# Patient Record
Sex: Male | Born: 1975 | Race: White | Hispanic: No | Marital: Single | Smoking: Current every day smoker
Health system: Southern US, Community
[De-identification: ages and names within clinical notes are randomized; demographics above are authoritative.]

## PROBLEM LIST (undated history)

## (undated) DIAGNOSIS — M2550 Pain in unspecified joint: Secondary | ICD-10-CM

## (undated) DIAGNOSIS — F17200 Nicotine dependence, unspecified, uncomplicated: Secondary | ICD-10-CM

## (undated) HISTORY — PX: MOUTH SURGERY: SHX715

---

## 2013-03-24 ENCOUNTER — Emergency Department (INDEPENDENT_AMBULATORY_CARE_PROVIDER_SITE_OTHER)
Admission: EM | Admit: 2013-03-24 | Discharge: 2013-03-24 | Disposition: A | Discharge status: Hospice | Payer: Self-pay | Source: Home / Self Care | Attending: Emergency Medicine | Admitting: Emergency Medicine

## 2013-03-24 ENCOUNTER — Emergency Department (HOSPITAL_COMMUNITY): Payer: Self-pay

## 2013-03-24 ENCOUNTER — Emergency Department (HOSPITAL_COMMUNITY)
Admission: EM | Admit: 2013-03-24 | Discharge: 2013-03-25 | Disposition: A | Discharge status: Home / Self Care | Payer: Self-pay | Attending: Emergency Medicine | Admitting: Emergency Medicine

## 2013-03-24 ENCOUNTER — Encounter (HOSPITAL_COMMUNITY): Payer: Self-pay | Admitting: Adult Health

## 2013-03-24 ENCOUNTER — Ambulatory Visit (HOSPITAL_COMMUNITY): Payer: Self-pay | Attending: Emergency Medicine

## 2013-03-24 ENCOUNTER — Encounter (HOSPITAL_COMMUNITY): Payer: Self-pay | Admitting: Emergency Medicine

## 2013-03-24 DIAGNOSIS — R109 Unspecified abdominal pain: Secondary | ICD-10-CM | POA: Insufficient documentation

## 2013-03-24 DIAGNOSIS — D45 Polycythemia vera: Secondary | ICD-10-CM

## 2013-03-24 DIAGNOSIS — A048 Other specified bacterial intestinal infections: Secondary | ICD-10-CM

## 2013-03-24 DIAGNOSIS — D751 Secondary polycythemia: Secondary | ICD-10-CM

## 2013-03-24 DIAGNOSIS — K5289 Other specified noninfective gastroenteritis and colitis: Secondary | ICD-10-CM

## 2013-03-24 DIAGNOSIS — F1310 Sedative, hypnotic or anxiolytic abuse, uncomplicated: Secondary | ICD-10-CM | POA: Insufficient documentation

## 2013-03-24 DIAGNOSIS — F17200 Nicotine dependence, unspecified, uncomplicated: Secondary | ICD-10-CM | POA: Insufficient documentation

## 2013-03-24 DIAGNOSIS — K529 Noninfective gastroenteritis and colitis, unspecified: Secondary | ICD-10-CM

## 2013-03-24 DIAGNOSIS — K760 Fatty (change of) liver, not elsewhere classified: Secondary | ICD-10-CM

## 2013-03-24 DIAGNOSIS — K700 Alcoholic fatty liver: Secondary | ICD-10-CM | POA: Insufficient documentation

## 2013-03-24 DIAGNOSIS — R197 Diarrhea, unspecified: Secondary | ICD-10-CM | POA: Insufficient documentation

## 2013-03-24 DIAGNOSIS — R112 Nausea with vomiting, unspecified: Secondary | ICD-10-CM | POA: Insufficient documentation

## 2013-03-24 DIAGNOSIS — R61 Generalized hyperhidrosis: Secondary | ICD-10-CM | POA: Insufficient documentation

## 2013-03-24 DIAGNOSIS — F411 Generalized anxiety disorder: Secondary | ICD-10-CM | POA: Insufficient documentation

## 2013-03-24 LAB — POCT URINALYSIS DIP (DEVICE)
Hgb urine dipstick: NEGATIVE
Nitrite: NEGATIVE
Protein, ur: NEGATIVE mg/dL
Urobilinogen, UA: 0.2 mg/dL (ref 0.0–1.0)
pH: 7 (ref 5.0–8.0)

## 2013-03-24 LAB — CBC WITH DIFFERENTIAL/PLATELET
Basophils Relative: 0 % (ref 0–1)
Basophils Relative: 0 % (ref 0–1)
Eosinophils Absolute: 0 10*3/uL (ref 0.0–0.7)
Eosinophils Absolute: 0 10*3/uL (ref 0.0–0.7)
Eosinophils Relative: 1 % (ref 0–5)
Eosinophils Relative: 0 % (ref 0–5)
Hemoglobin: 22.5 g/dL (ref 13.0–17.0)
Lymphs Abs: 1.5 10*3/uL (ref 0.7–4.0)
Lymphs Abs: 1.5 10*3/uL (ref 0.7–4.0)
MCH: 35.9 pg — ABNORMAL HIGH (ref 26.0–34.0)
MCH: 35.2 pg — ABNORMAL HIGH (ref 26.0–34.0)
MCHC: 37.1 g/dL — ABNORMAL HIGH (ref 30.0–36.0)
MCHC: 36.2 g/dL — ABNORMAL HIGH (ref 30.0–36.0)
MCV: 96.8 fL (ref 78.0–100.0)
MCV: 97.1 fL (ref 78.0–100.0)
Monocytes Relative: 9 % (ref 3–12)
Neutrophils Relative %: 73 % (ref 43–77)
Neutrophils Relative %: 75 % (ref 43–77)
Platelets: 213 10*3/uL (ref 150–400)
Platelets: 187 10*3/uL (ref 150–400)
RBC: 5.6 MIL/uL (ref 4.22–5.81)
RDW: 13.7 % (ref 11.5–15.5)
WBC: 8.7 10*3/uL (ref 4.0–10.5)

## 2013-03-24 LAB — RAPID URINE DRUG SCREEN, HOSP PERFORMED
Amphetamines: NOT DETECTED
Barbiturates: NOT DETECTED
Benzodiazepines: NOT DETECTED
Cocaine: NOT DETECTED

## 2013-03-24 LAB — URINALYSIS, ROUTINE W REFLEX MICROSCOPIC
Glucose, UA: NEGATIVE mg/dL
Ketones, ur: 15 mg/dL — AB
Protein, ur: NEGATIVE mg/dL
Urobilinogen, UA: 0.2 mg/dL (ref 0.0–1.0)
pH: 6 (ref 5.0–8.0)

## 2013-03-24 LAB — COMPREHENSIVE METABOLIC PANEL
ALT: 15 U/L (ref 0–53)
AST: 22 U/L (ref 0–37)
Albumin: 3.5 g/dL (ref 3.5–5.2)
CO2: 23 mEq/L (ref 19–32)
Calcium: 8.4 mg/dL (ref 8.4–10.5)
GFR calc Af Amer: >90 mL/min (ref >90)
GFR calc non Af Amer: >90 mL/min (ref >90)
Glucose, Bld: 104 mg/dL — ABNORMAL HIGH (ref 70–99)
Potassium: 3.7 mEq/L (ref 3.5–5.1)
Sodium: 138 mEq/L (ref 135–145)
Total Bilirubin: 0.8 mg/dL (ref 0.3–1.2)
Total Protein: 6.2 g/dL (ref 6.0–8.3)

## 2013-03-24 LAB — POCT I-STAT, CHEM 8
BUN: <3 mg/dL — ABNORMAL LOW (ref 6–23)
Calcium, Ion: 1.11 mmol/L — ABNORMAL LOW (ref 1.12–1.23)
Creatinine, Ser: 0.9 mg/dL (ref 0.50–1.35)
Hemoglobin: 22.8 g/dL (ref 13.0–17.0)
Potassium: 4.2 mEq/L (ref 3.5–5.1)
Sodium: 140 mEq/L (ref 135–145)
TCO2: 31 mmol/L (ref 0–100)

## 2013-03-24 LAB — LIPASE, BLOOD: Lipase: 38 U/L (ref 11–59)

## 2013-03-24 LAB — URINE MICROSCOPIC-ADD ON

## 2013-03-24 MED ORDER — IBUPROFEN 800 MG PO TABS
800.0000 mg | ORAL_TABLET | Freq: Once | ORAL | Status: AC
  Administered 2013-03-24: 800 mg via ORAL

## 2013-03-24 MED ORDER — IOHEXOL 300 MG/ML  SOLN
100.0000 mL | Freq: Once | INTRAMUSCULAR | Status: AC | PRN
  Administered 2013-03-24: 100 mL via INTRAVENOUS

## 2013-03-24 MED ORDER — OMEPRAZOLE 20 MG PO CPDR: 20.0000 mg | DELAYED_RELEASE_CAPSULE | Freq: Two times a day (BID) | ORAL | Status: DC

## 2013-03-24 MED ORDER — ONDANSETRON HCL 4 MG/2ML IJ SOLN
4.0000 mg | Freq: Once | INTRAMUSCULAR | Status: AC
  Administered 2013-03-24: 4 mg via INTRAVENOUS
  Filled 2013-03-24: qty 2

## 2013-03-24 MED ORDER — LORAZEPAM 2 MG/ML IJ SOLN
1.0000 mg | Freq: Once | INTRAMUSCULAR | Status: AC
  Administered 2013-03-24: 1 mg via INTRAVENOUS
  Filled 2013-03-24: qty 1

## 2013-03-24 MED ORDER — TETRACYCLINE HCL 500 MG PO CAPS: 500.0000 mg | ORAL_CAPSULE | Freq: Four times a day (QID) | ORAL | Status: DC

## 2013-03-24 MED ORDER — IOHEXOL 300 MG/ML  SOLN
25.0000 mL | Freq: Once | INTRAMUSCULAR | Status: AC | PRN
  Administered 2013-03-24: 25 mL via ORAL

## 2013-03-24 MED ORDER — IBUPROFEN 800 MG PO TABS
ORAL_TABLET | ORAL | Status: AC
  Filled 2013-03-24: qty 1

## 2013-03-24 MED ORDER — BISMUTH SUBSALICYLATE 262 MG PO TABS: 524.0000 mg | ORAL_TABLET | Freq: Four times a day (QID) | ORAL | Status: DC

## 2013-03-24 MED ORDER — SODIUM CHLORIDE 0.9 % IV SOLN
Freq: Once | INTRAVENOUS | Status: AC
  Administered 2013-03-24: 21:00:00 via INTRAVENOUS

## 2013-03-24 MED ORDER — MORPHINE SULFATE 4 MG/ML IJ SOLN
4.0000 mg | Freq: Once | INTRAMUSCULAR | Status: AC
  Administered 2013-03-24: 4 mg via INTRAVENOUS
  Filled 2013-03-24: qty 1

## 2013-03-24 MED ORDER — METRONIDAZOLE 500 MG PO TABS: 500.0000 mg | ORAL_TABLET | Freq: Two times a day (BID) | ORAL | Status: DC

## 2013-03-24 NOTE — ED Provider Notes (Signed)
Chief Complaint:   Chief Complaint  Patient presents with  . Abdominal Pain    History of Present Illness:    Lawrence Mack is a 37 year old male who a one year history of severe left lower quadrant pain which radiates toward the left upper quadrant but not into the back. Pain is constant and nothing makes it worse or better. It feels like a knife cutting from the inside. Is rated 8-10 over 10 in intensity on average, and right now it's a 9/10. He's had nausea and vomiting and diarrhea for the past 2 weeks. The vomiting is intermittent and sometimes bilious. He is intermittent diarrhea and sometimes his stools have been black. He has explosive gas. He's lost weight, doesn't have any appetite, and is only able to tolerate couple tablespoons of food without a feeling of satiety. He denies any bright red blood in the vomitus or the stool. He's had no urinary symptoms. No fever, chills, or sweats. He has not seen a physician for this previously. He admits to drinking 4 beers per day and does smoke cigarettes and marijuana. He denies any use of anabolic steroids.  Review of Systems:  Other than noted above, the patient denies any of the following symptoms: Constitutional:  No fever, chills, fatigue, weight loss or anorexia. Lungs:  No cough or shortness of breath. Heart:  No chest pain, palpitations, syncope or edema.  No cardiac history. Abdomen:  No nausea, vomiting, hematememesis, melena, diarrhea, or hematochezia. GU:  No dysuria, frequency, urgency, or hematuria.  No testicular pain or swelling.  PMFSH:  Past medical history, family history, social history, meds, and allergies were reviewed along with nurse's notes.  No prior abdominal surgeries or history of GI problems.  No use of NSAIDs or aspirin.  No excessive  alcohol intake.   Physical Exam:   Vital signs:  BP 131/53  Pulse 96  Temp(Src) 99 F (37.2 C) (Oral)  Resp 23  SpO2 96% Gen:  Alert, oriented, in no distress. Lungs:  Breath  sounds clear and equal bilaterally.  No wheezes, rales or rhonchi. Heart:  Regular rhythm.  No gallops or murmers.   Abdomen:  Soft, flat, and nondistended. There is moderate tenderness to palpation in the left lower quadrant without guarding or rebound. No organomegaly or mass. Bowel sounds are hyperactive. Genital exam: No hernias, testes normal, no inguinal lymphadenopathy, no urethral discharge. Rectal exam:  Normal sphincter tone, no rectal masses, no prostatic masses or tenderness, stool is heme negative. Skin:  Clear, warm and dry.  No rash.  Labs:   Results for orders placed during the hospital encounter of 03/24/13  CBC WITH DIFFERENTIAL      Result Value Range   WBC 8.7  4.0 - 10.5 K/uL   RBC 6.26 (*) 4.22 - 5.81 MIL/uL   Hemoglobin 22.5 (*) 13.0 - 17.0 g/dL   HCT 16.1 (*) 09.6 - 04.5 %   MCV 96.8  78.0 - 100.0 fL   MCH 35.9 (*) 26.0 - 34.0 pg   MCHC 37.1 (*) 30.0 - 36.0 g/dL   RDW 40.9  81.1 - 91.4 %   Platelets 213  150 - 400 K/uL   Neutrophils Relative % 73  43 - 77 %   Neutro Abs 6.3  1.7 - 7.7 K/uL   Lymphocytes Relative 17  12 - 46 %   Lymphs Abs 1.5  0.7 - 4.0 K/uL   Monocytes Relative 9  3 - 12 %   Monocytes Absolute 0.8  0.1 - 1.0 K/uL   Eosinophils Relative 1  0 - 5 %   Eosinophils Absolute 0.0  0.0 - 0.7 K/uL   Basophils Relative 0  0 - 1 %   Basophils Absolute 0.0  0.0 - 0.1 K/uL  POCT H PYLORI SCREEN      Result Value Range   H. PYLORI SCREEN, POC POSITIVE (*) NEGATIVE  POCT I-STAT, CHEM 8      Result Value Range   Sodium 140  135 - 145 mEq/L   Potassium 4.2  3.5 - 5.1 mEq/L   Chloride 96  96 - 112 mEq/L   BUN <3 (*) 6 - 23 mg/dL   Creatinine, Ser 4.09  0.50 - 1.35 mg/dL   Glucose, Bld 811 (*) 70 - 99 mg/dL   Calcium, Ion 9.14 (*) 1.12 - 1.23 mmol/L   TCO2 31  0 - 100 mmol/L   Hemoglobin 22.8 (*) 13.0 - 17.0 g/dL   HCT 78.2 (*) 95.6 - 21.3 %  POCT URINALYSIS DIP (DEVICE)      Result Value Range   Glucose, UA NEGATIVE  NEGATIVE mg/dL   Bilirubin  Urine SMALL (*) NEGATIVE   Ketones, ur TRACE (*) NEGATIVE mg/dL   Specific Gravity, Urine 1.025  1.005 - 1.030   Hgb urine dipstick NEGATIVE  NEGATIVE   pH 7.0  5.0 - 8.0   Protein, ur NEGATIVE  NEGATIVE mg/dL   Urobilinogen, UA 0.2  0.0 - 1.0 mg/dL   Nitrite NEGATIVE  NEGATIVE   Leukocytes, UA TRACE (*) NEGATIVE  OCCULT BLOOD, POC DEVICE      Result Value Range   Fecal Occult Bld NEGATIVE  NEGATIVE     Radiology:  Dg Abd Acute W/chest  03/24/2013   *RADIOLOGY REPORT*  Clinical Data: Abdominal pain for 1 year, mostly on the left side  ACUTE ABDOMEN SERIES (ABDOMEN 2 VIEW & CHEST 1 VIEW)  Comparison: None.  Findings: The heart size and vascular pattern are normal.  The lungs are clear.  There is no free air.  There are no abnormally dilated loops of bowel.  There are no abnormal air-fluid levels. Calcific densities in the bilateral inferior pelvis likely represent phleboliths.Colonic haustration appears mildly prominent but is likely exaggerated by colonic underdistension.  IMPRESSION: No definite abnormalities- mildly prominent colonic haustration can be seen with colonic inflammation but may be simply the result of underdistention.  Further investigation with CT abdomen/pelvis could be considered.   Original Report Authenticated By: Esperanza Heir, M.D.   Assessment:  The primary encounter diagnosis was Colitis. Diagnoses of H. pylori infection and Polycythemia were also pertinent to this visit.  Differential diagnosis of the abdominal pain is extensive, possibly colitis or diverticulitis. I believe he needs a CT scan as has been recommended by the radiologist.  He also has positive H. pylori serology. This may be contributing to his abdominal pain syndrome. This will need treatment as well and followup with gastroenterology.  Finally he has significant polycythemia. He'll need followup with hematology. I have encouraged him to get extra fluids, cut back on his drinking and smoking.  Plan:    The patient was transferred to the ED via shuttle in stable condition.  Medical Decision Making:  37 year old male has a 1 year history of LLQ pain, radiating to LUQ, rated 9/10 and associated with nausea, vomiting, diarrhea and explosive gas. He has lost weight, but doesn't know how much.  Had not been in for any medical care before today.  Admits to drinking 4 beers per day.  Workup reveals possible inflammation of colonic mucosa and a CT was recommended, positive serology for H. Pylori, and polycythemia with a Hg of 22.5  And an hematocrit of 60.6%.  All other blood elements normal.  He is being transferred for further evaluation--possibly a CT scan, but also needs treatment for H. Pylori and referral for polycythemia.         Reuben Likes, MD 03/24/13 [phone]

## 2013-03-24 NOTE — ED Notes (Signed)
Reports left lower abdominal pain initially, now pain is left upper quadrant.  Pain for at least 2 weeks.  Also reports n/v/d intermittently for 2 weeks.

## 2013-03-24 NOTE — ED Notes (Signed)
Sent from [facility] with + H. Pylori screen, and polycethemia Hgb 22.5 and hemotocrit of 60.6%. 1 year hx of LLQ pain, rated 9/10 associated with nausea, vomiting and explosive diarrhea. Chest x ray shows possible inflammation of colonic mucosa, CT recommended, [facility] sent him here for CT and to be treated for H.Pylori.

## 2013-03-24 NOTE — ED Provider Notes (Signed)
CSN: [id number]     Arrival date & time 03/24/13  1739 History   First MD Initiated Contact with Patient 03/24/13 2007     Chief Complaint  Patient presents with  . Abdominal Pain   (Consider location/radiation/quality/duration/timing/severity/associated sxs/prior Treatment) HPI  Lawrence Mack is a 37 year old male who a 2 week history of severe left lower quadrant pain which radiates toward the left upper quadrant. Pain is constant and nothing makes it worse or better.Described as sharp and stabbing, constant and severe. He's had nausea, vomiting, and diarrhea. Vomitous is NB/NB. stoll color is varied and he denies dark tarry stools, black or bloody stools.He's been unable to hold down more than a few small bites of bread and sips of water.  Patient drinks 4-6 beers daily. He endorses anxiety. Denies fevers, chills, myalgias, arthralgias. Denies DOE, SOB, chest tightness or pressure, radiation to left arm, jaw or back, or diaphoresis. Denies dysuria, flank pain, suprapubic pain, frequency, urgency, or hematuria. Denies headaches or, visual disturbances.   History reviewed. No pertinent past medical history. History reviewed. No pertinent past surgical history. History reviewed. No pertinent family history. History  Substance Use Topics  . Smoking status: Not on file  . Smokeless tobacco: Not on file  . Alcohol Use: 3.6 oz/week    6 Cans of beer per week     Comment: 4-6 beers/day    Review of Systems Ten systems reviewed and are negative for acute change, except as noted in the HPI.   Allergies  Review of patient's allergies indicates no known allergies.  Home Medications  No current outpatient prescriptions on file. BP 124/83  Pulse 69  Temp(Src) 98.7 F (37.1 C) (Oral)  Resp 18  SpO2 99% Physical Exam  Nursing note and vitals reviewed. Constitutional: He appears well-developed and well-nourished. No distress.  HENT:  Head: Normocephalic and atraumatic.  Eyes:  Conjunctivae and EOM are normal. Pupils are equal, round, and reactive to light. No scleral icterus.  Neck: Normal range of motion. Neck supple.  Cardiovascular: Normal rate, regular rhythm and normal heart sounds.   Pulmonary/Chest: Effort normal and breath sounds normal. No respiratory distress.  Abdominal: Soft. Bowel sounds are normal. He exhibits no distension. There is tenderness. There is no rebound and no guarding.  Musculoskeletal: He exhibits no edema.  Neurological: He is alert.  Skin: Skin is warm. He is diaphoretic.  Psychiatric: His behavior is normal.    ED Course  Procedures (including critical care time) Labs Review Labs Reviewed  CBC WITH DIFFERENTIAL  COMPREHENSIVE METABOLIC PANEL  LIPASE, BLOOD  URINE RAPID DRUG SCREEN (HOSP PERFORMED)  URINALYSIS, ROUTINE W REFLEX MICROSCOPIC   Imaging Review Dg Abd Acute W/chest  03/24/2013   *RADIOLOGY REPORT*  Clinical Data: Abdominal pain for 1 year, mostly on the left side  ACUTE ABDOMEN SERIES (ABDOMEN 2 VIEW & CHEST 1 VIEW)  Comparison: None.  Findings: The heart size and vascular pattern are normal.  The lungs are clear.  There is no free air.  There are no abnormally dilated loops of bowel.  There are no abnormal air-fluid levels. Calcific densities in the bilateral inferior pelvis likely represent phleboliths.Colonic haustration appears mildly prominent but is likely exaggerated by colonic underdistension.  IMPRESSION: No definite abnormalities- mildly prominent colonic haustration can be seen with colonic inflammation but may be simply the result of underdistention.  Further investigation with CT abdomen/pelvis could be considered.   Original Report Authenticated By: Esperanza Heir, M.D.    MDM   1.  Abdominal pain   2. H. pylori infection   3. Fatty liver    Patient with positive H. Pylori and polycythemia with a hgb of 22 at [facility]. I have explained the h.pylor test . CT, Labs pending.   Patient with no acute  abnormlaity on labs. CT negative. His is feling much better and able to tolerate fluids. Patient will be d.c home with quadruple therapy for H.pylori infection due to cost . F/u with community health. The patient appears reasonably screened and/or stabilized for discharge and I doubt any other medical condition or other [facility] requiring further screening, evaluation, or treatment in the ED at this time prior to discharge.   Arthor Captain, PA-C 03/26/13 0121

## 2013-03-24 NOTE — ED Notes (Signed)
Patient transported to CT 

## 2013-03-24 NOTE — ED Notes (Signed)
Patient transported to X-ray 

## 2013-03-24 NOTE — ED Notes (Signed)
Returned from CT.  Pain remains 4/10 "the best I have felt in two weeks"

## 2013-03-25 NOTE — ED Provider Notes (Signed)
1:49 PM Tetracycline no longer available in pharmacies. Written for 10 days of amoxicillin and clarithromycin. Called into pharmacy by ancillary staff  Lyanne Co, MD 03/25/13 1350

## 2013-03-26 ENCOUNTER — Other Ambulatory Visit: Payer: Self-pay

## 2013-03-26 ENCOUNTER — Encounter (HOSPITAL_COMMUNITY): Payer: Self-pay | Admitting: *Deleted

## 2013-03-26 DIAGNOSIS — Z792 Long term (current) use of antibiotics: Secondary | ICD-10-CM | POA: Insufficient documentation

## 2013-03-26 DIAGNOSIS — F17200 Nicotine dependence, unspecified, uncomplicated: Secondary | ICD-10-CM | POA: Insufficient documentation

## 2013-03-26 DIAGNOSIS — R1012 Left upper quadrant pain: Secondary | ICD-10-CM | POA: Insufficient documentation

## 2013-03-26 DIAGNOSIS — F329 Major depressive disorder, single episode, unspecified: Secondary | ICD-10-CM | POA: Insufficient documentation

## 2013-03-26 DIAGNOSIS — E860 Dehydration: Secondary | ICD-10-CM | POA: Insufficient documentation

## 2013-03-26 DIAGNOSIS — Z79899 Other long term (current) drug therapy: Secondary | ICD-10-CM | POA: Insufficient documentation

## 2013-03-26 DIAGNOSIS — F3289 Other specified depressive episodes: Secondary | ICD-10-CM | POA: Insufficient documentation

## 2013-03-26 DIAGNOSIS — Z87828 Personal history of other (healed) physical injury and trauma: Secondary | ICD-10-CM | POA: Insufficient documentation

## 2013-03-26 DIAGNOSIS — R0602 Shortness of breath: Secondary | ICD-10-CM | POA: Insufficient documentation

## 2013-03-26 LAB — CBC WITH DIFFERENTIAL/PLATELET
Eosinophils Relative: 1 % (ref 0–5)
HCT: 57.6 % — ABNORMAL HIGH (ref 39.0–52.0)
Lymphocytes Relative: 19 % (ref 12–46)
Lymphs Abs: 1.9 10*3/uL (ref 0.7–4.0)
MCHC: 36.8 g/dL — ABNORMAL HIGH (ref 30.0–36.0)
MCV: 97.8 fL (ref 78.0–100.0)
Neutro Abs: 6.8 10*3/uL (ref 1.7–7.7)
Neutrophils Relative %: 69 % (ref 43–77)
Platelets: 220 10*3/uL (ref 150–400)
RBC: 5.89 MIL/uL — ABNORMAL HIGH (ref 4.22–5.81)
WBC: 9.8 10*3/uL (ref 4.0–10.5)

## 2013-03-26 LAB — COMPREHENSIVE METABOLIC PANEL
ALT: 16 U/L (ref 0–53)
Alkaline Phosphatase: 61 U/L (ref 39–117)
CO2: 27 mEq/L (ref 19–32)
Calcium: 8.8 mg/dL (ref 8.4–10.5)
Chloride: 102 mEq/L (ref 96–112)
GFR calc Af Amer: >90 mL/min (ref >90)
GFR calc non Af Amer: >90 mL/min (ref >90)
Glucose, Bld: 107 mg/dL — ABNORMAL HIGH (ref 70–99)
Potassium: 3.4 mEq/L — ABNORMAL LOW (ref 3.5–5.1)
Sodium: 141 mEq/L (ref 135–145)
Total Bilirubin: 1.1 mg/dL (ref 0.3–1.2)

## 2013-03-26 NOTE — ED Provider Notes (Signed)
Medical screening examination/treatment/procedure(s) were performed by non-physician practitioner and as supervising physician I was immediately available for consultation/collaboration.   Nakita Santerre S Mercadez Heitman, MD 03/26/13 2200 

## 2013-03-26 NOTE — ED Notes (Signed)
The pt has had pain and numbness in his face and in his lt arm all day .  That started at 0800am today.  He has been taking percocet for  Numerus complaints and he reports that he does  Want to take them any more.

## 2013-03-26 NOTE — ED Notes (Signed)
He also has pain along with the numbness.  He thinks it is the percocet.  No alcohol.  He was sen here last week for the same complaints

## 2013-03-27 ENCOUNTER — Emergency Department (HOSPITAL_COMMUNITY): Payer: Self-pay

## 2013-03-27 ENCOUNTER — Emergency Department (HOSPITAL_COMMUNITY)
Admission: EM | Admit: 2013-03-27 | Discharge: 2013-03-27 | Disposition: A | Discharge status: Home / Self Care | Payer: Self-pay | Attending: Emergency Medicine | Admitting: Emergency Medicine

## 2013-03-27 DIAGNOSIS — E860 Dehydration: Secondary | ICD-10-CM

## 2013-03-27 DIAGNOSIS — R109 Unspecified abdominal pain: Secondary | ICD-10-CM

## 2013-03-27 DIAGNOSIS — R197 Diarrhea, unspecified: Secondary | ICD-10-CM

## 2013-03-27 LAB — LIPASE, BLOOD: Lipase: 50 U/L (ref 11–59)

## 2013-03-27 LAB — URINALYSIS, ROUTINE W REFLEX MICROSCOPIC
Glucose, UA: NEGATIVE mg/dL
Hgb urine dipstick: NEGATIVE
Ketones, ur: NEGATIVE mg/dL
Protein, ur: NEGATIVE mg/dL
Specific Gravity, Urine: >1.03 — ABNORMAL HIGH (ref 1.005–1.030)
Urobilinogen, UA: 1 mg/dL (ref 0.0–1.0)

## 2013-03-27 LAB — POCT I-STAT TROPONIN I: Troponin i, poc: 0 ng/mL (ref 0.00–0.08)

## 2013-03-27 LAB — RAPID URINE DRUG SCREEN, HOSP PERFORMED
Amphetamines: NOT DETECTED
Barbiturates: NOT DETECTED
Tetrahydrocannabinol: POSITIVE — AB

## 2013-03-27 MED ORDER — ONDANSETRON HCL 4 MG/2ML IJ SOLN
4.0000 mg | Freq: Once | INTRAMUSCULAR | Status: AC
  Administered 2013-03-27: 4 mg via INTRAVENOUS
  Filled 2013-03-27: qty 2

## 2013-03-27 MED ORDER — CLONIDINE HCL 0.2 MG PO TABS: 0.1000 mg | ORAL_TABLET | Freq: Two times a day (BID) | ORAL | Status: DC

## 2013-03-27 MED ORDER — CLONIDINE HCL 0.1 MG PO TABS
0.1000 mg | ORAL_TABLET | Freq: Once | ORAL | Status: AC
  Administered 2013-03-27: 0.1 mg via ORAL
  Filled 2013-03-27: qty 1

## 2013-03-27 MED ORDER — ONDANSETRON HCL 4 MG PO TABS: 4.0000 mg | ORAL_TABLET | Freq: Four times a day (QID) | ORAL | Status: DC

## 2013-03-27 MED ORDER — SODIUM CHLORIDE 0.9 % IV BOLUS (SEPSIS)
1000.0000 mL | Freq: Once | INTRAVENOUS | Status: AC
  Administered 2013-03-27: 1000 mL via INTRAVENOUS

## 2013-03-27 NOTE — ED Notes (Signed)
Vital checked advised of wait time

## 2013-03-27 NOTE — ED Notes (Signed)
Vitals were taken same time pt was being Triaged.  RN aware of error.

## 2013-03-27 NOTE — ED Notes (Signed)
The pts lab results discussed with dr Tanna Savoy.  No lab change from last week

## 2013-03-27 NOTE — ED Notes (Signed)
Family at bedside. 

## 2013-03-27 NOTE — ED Provider Notes (Addendum)
CSN: [id number]     Arrival date & time 03/26/13  2239 History   First MD Initiated Contact with Patient 03/27/13 0109     Chief Complaint  Patient presents with  . Numbness    face   (Consider location/radiation/quality/duration/timing/severity/associated sxs/prior Treatment) HPI Comments: Pt states he has been sick for the last 2 weeks and seen 3 days ago and ddx with h.pylori and dehydration and treated with abx.  Since that time he has continued to be nauseated with decreased vomiting and has also been having severe diarrhea over the last2 days.  Pt states that over the last 2 days he has stopped using alcohol (6pack a day) and cut down significantly on his percocet.  He wants to stop using all together.  He also states he has been using because he is depressed but denies SI.  Tonight after taking a percocet he developed left arm pain and numbness and some SOB.  He denies any CP at that time.    Pt does admit to shoulder injury years ago with persistent left shoulder pain.  The history is provided by the patient.    Past Medical History  Diagnosis Date  . Narcotic abuse    History reviewed. No pertinent past surgical history. No family history on file. History  Substance Use Topics  . Smoking status: Current Every Day Smoker  . Smokeless tobacco: Not on file  . Alcohol Use: 3.6 oz/week    6 Cans of beer per week     Comment: 4-6 beers/day    Review of Systems  Constitutional: Negative for fever and chills.  Respiratory: Positive for shortness of breath.   Gastrointestinal: Positive for nausea, vomiting, abdominal pain and diarrhea.  All other systems reviewed and are negative.    Allergies  Review of patient's allergies indicates no known allergies.  Home Medications   Current Outpatient Rx  Name  Route  Sig  Dispense  Refill  . amoxicillin (AMOXIL) 500 MG capsule   Oral   Take 1,000 mg by mouth 2 (two) times daily. Take 2 capsules by mouth 2 times daily for 10  days.         . clarithromycin (BIAXIN) 500 MG tablet   Oral   Take 500 mg by mouth 2 (two) times daily. Take 1 tablet by mouth twice a day for 10 days.         . metroNIDAZOLE (FLAGYL) 500 MG tablet   Oral   Take 500 mg by mouth 2 (two) times daily.         Marland Kitchen omeprazole (PRILOSEC) 20 MG capsule   Oral   Take 20 mg by mouth 2 (two) times daily.          BP 144/111  Pulse 91  Temp(Src) 98.5 F (36.9 C) (Oral)  Resp 16  SpO2 99% Physical Exam  Nursing note and vitals reviewed. Constitutional: He is oriented to person, place, and time. He appears well-developed and well-nourished. No distress.  HENT:  Head: Normocephalic and atraumatic.  Mouth/Throat: Oropharynx is clear and moist.  Eyes: Conjunctivae and EOM are normal. Pupils are equal, round, and reactive to light.  Neck: Normal range of motion. Neck supple.  Cardiovascular: Normal rate, regular rhythm and intact distal pulses.   No murmur heard. Pulmonary/Chest: Effort normal and breath sounds normal. No respiratory distress. He has no wheezes. He has no rales.  Abdominal: Soft. He exhibits no distension. There is tenderness in the left upper quadrant. There is  no rebound and no guarding.  Musculoskeletal: Normal range of motion. He exhibits no edema and no tenderness.  Neurological: He is alert and oriented to person, place, and time.  Skin: Skin is warm and dry. No rash noted. No erythema.  Psychiatric: His behavior is normal. He exhibits a depressed mood. He expresses no homicidal and no suicidal ideation. He expresses no suicidal plans and no homicidal plans.    ED Course  Procedures (including critical care time) Labs Review Labs Reviewed  CBC WITH DIFFERENTIAL - Abnormal; Notable for the following:    RBC 5.89 (*)    Hemoglobin 21.2 (*)    HCT 57.6 (*)    MCH 36.0 (*)    MCHC 36.8 (*)    All other components within normal limits  COMPREHENSIVE METABOLIC PANEL - Abnormal; Notable for the following:     Potassium 3.4 (*)    Glucose, Bld 107 (*)    BUN <3 (*)    All other components within normal limits  ETHANOL  LIPASE, BLOOD  URINALYSIS, ROUTINE W REFLEX MICROSCOPIC  URINE RAPID DRUG SCREEN (HOSP PERFORMED)  POCT I-STAT TROPONIN I   Imaging Review Dg Chest 2 View  03/27/2013   CLINICAL DATA:  Pain, numbness.  EXAM: CHEST  2 VIEW  COMPARISON:  03/24/2013  FINDINGS: The heart size and mediastinal contours are within normal limits. Both lungs are clear. The visualized skeletal structures are unremarkable.  IMPRESSION: No active cardiopulmonary disease.   Electronically Signed   By: Charlett Nose M.D.   On: 03/27/2013 01:49    Date: 03/27/2013  Rate: 99  Rhythm: normal sinus rhythm  QRS Axis: normal  Intervals: normal  ST/T Wave abnormalities: normal  Conduction Disutrbances: none  Narrative Interpretation: unremarkable      MDM   1. Dehydration   2. Diarrhea   3. Abdominal  pain, other specified site     Patient recently seen here 3 days ago and treated for epigastric tenderness and nausea and vomiting. He was noted to be polycythemic at that time and treated for H. pylori infection. Patient states since that time he has had persistent nausea but the vomiting has improved and he has been able to eat the last 2 days but still felt bad today. He started having some left-sided arm numbness and some shortness of breath after taking Percocet today. Patient admits to relapsing on Percocet and over the last month he has attempted to wean himself off. Over the last 2 days he's decreased his use to a quarter of a pill and that the diarrhea started. Also patient has been drinking a sixpack of beer daily for the last year and over the last 2 days has quit drinking as well. Feel most likely the patient's symptoms today are related to withdrawal from substances. He denies any seizures or confusion. No prior heart history. Labs indicated polycythemia and unclear if that's due to dehydration or if  patient truly has polycythemia. He EtOH, UA UDS, lipase pending. Troponin is negative EKG and chest x-ray are pending. Patient given IV fluids and nausea control. Discussed with him outpatient resources for substance abuse and depression. He denies any suicidal ideation at this time.  3:12 AM Pt's EKG and labs wnl.  Findings discussed with pt and will d/c home with clonidine, zofran and outpt resources  Gwyneth Sprout, MD 03/27/13 1610  Gwyneth Sprout, MD 03/27/13 [phone]

## 2013-05-15 ENCOUNTER — Ambulatory Visit: Payer: Self-pay | Attending: Internal Medicine | Admitting: Internal Medicine

## 2013-05-15 ENCOUNTER — Encounter: Payer: Self-pay | Admitting: Internal Medicine

## 2013-05-15 VITALS — BP 132/82 | HR 77 | Temp 98.0°F | Resp 16 | Ht 69.0 in | Wt 142.0 lb

## 2013-05-15 DIAGNOSIS — A048 Other specified bacterial intestinal infections: Secondary | ICD-10-CM | POA: Insufficient documentation

## 2013-05-15 DIAGNOSIS — I10 Essential (primary) hypertension: Secondary | ICD-10-CM

## 2013-05-15 NOTE — Progress Notes (Signed)
Patient ID: Ivor Kishi, male   DOB: Feb 06, 1976, 37 y.o.   MRN: [id number]  CC: follow up  HPI: 37 year old male with no significant past medical history, recent treatment for H. Floor infection comes to clinic for follow up. Patient has completed her treatment for 10 days and feels great. No complaints of abdominal pain, nausea or vomiting. No constipation or diarrhea.  No Known Allergies Past Medical History  Diagnosis Date  . Narcotic abuse    Current Outpatient Prescriptions on File Prior to Visit  Medication Sig Dispense Refill  . cloNIDine (CATAPRES) 0.2 MG tablet Take 0.5 tablets (0.1 mg total) by mouth 2 (two) times daily.  10 tablet  0   No current facility-administered medications on file prior to visit.   HTN in mother.  History   Social History  . Marital Status: Single    Spouse Name: N/A    Number of Children: N/A  . Years of Education: N/A   Occupational History  . Not on file.   Social History Main Topics  . Smoking status: Current Every Day Smoker  . Smokeless tobacco: Not on file  . Alcohol Use: 3.6 oz/week    6 Cans of beer per week     Comment: 4-6 beers/day  . Drug Use: Yes    Special: Marijuana  . Sexual Activity: Not on file   Other Topics Concern  . Not on file   Social History Narrative  . No narrative on file    Review of Systems  Constitutional: Negative for fever, chills, diaphoresis, activity change, appetite change and fatigue.  HENT: Negative for ear pain, nosebleeds, congestion, facial swelling, rhinorrhea, neck pain, neck stiffness and ear discharge.   Eyes: Negative for pain, discharge, redness, itching and visual disturbance.  Respiratory: Negative for cough, choking, chest tightness, shortness of breath, wheezing and stridor.   Cardiovascular: Negative for chest pain, palpitations and leg swelling.  Gastrointestinal: Negative for abdominal distention.  Genitourinary: Negative for dysuria, urgency, frequency, hematuria, flank  pain, decreased urine volume, difficulty urinating and dyspareunia.  Musculoskeletal: Negative for back pain, joint swelling, arthralgias and gait problem.  Neurological: Negative for dizziness, tremors, seizures, syncope, facial asymmetry, speech difficulty, weakness, light-headedness, numbness and headaches.  Hematological: Negative for adenopathy. Does not bruise/bleed easily.  Psychiatric/Behavioral: Negative for hallucinations, behavioral problems, confusion, dysphoric mood, decreased concentration and agitation.    Objective:   Filed Vitals:   05/15/13 1429  BP: 132/82  Pulse: 77  Temp: 98 F (36.7 C)  Resp: 16    Physical Exam  Constitutional: Appears well-developed and well-nourished. No distress.  HENT: Normocephalic. External right and left ear normal. Oropharynx is clear and moist.  Eyes: Conjunctivae and EOM are normal. PERRLA, no scleral icterus.  Neck: Normal ROM. Neck supple. No JVD. No tracheal deviation. No thyromegaly.  CVS: RRR, S1/S2 +, no murmurs, no gallops, no carotid bruit.  Pulmonary: Effort and breath sounds normal, no stridor, rhonchi, wheezes, rales.  Abdominal: Soft. BS +,  no distension, tenderness, rebound or guarding.  Musculoskeletal: Normal range of motion. No edema and no tenderness.  Lymphadenopathy: No lymphadenopathy noted, cervical, inguinal. Neuro: Alert. Normal reflexes, muscle tone coordination. No cranial nerve deficit. Skin: Skin is warm and dry. No rash noted. Not diaphoretic. No erythema. No pallor.  Psychiatric: Normal mood and affect. Behavior, judgment, thought content normal.   Lab Results  Component Value Date   WBC 9.8 03/26/2013   HGB 21.2* 03/26/2013   HCT 57.6* 03/26/2013   MCV 97.8  03/26/2013   PLT 220 03/26/2013   Lab Results  Component Value Date   CREATININE 0.64 03/26/2013   BUN <3* 03/26/2013   NA 141 03/26/2013   K 3.4* 03/26/2013   CL 102 03/26/2013   CO2 27 03/26/2013    No results found for this basename: HGBA1C    Lipid Panel  No results found for this basename: chol, trig, hdl, cholhdl, vldl, ldlcalc       Assessment and plan:   Patient Active Problem List   Diagnosis Date Noted  . H. pylori infection 05/15/2013    Priority: High - Patient has been treated for H. pylori, has completed her treatment. No complaints at this time  - GI referral provided for follow up

## 2013-05-15 NOTE — Patient Instructions (Addendum)
Helicobacter Pylori Antibodies Test °This is a blood test which looks for a germ called Helicobacter pylori. This can also be diagnosed by a breath test or a microscopic examination of a portion (biopsy) of the small bowel. H. pylori is a germ that is found in the cells that line the stomach. It is a risk factor for stomach and small bowel ulcers, long standing inflammation of the lining of the stomach, or even ulcers that may occur in the esophagus (the canal that runs from the mouth to the stomach). This bacterium is also a factor in stomach cancer. The amount of the bacteria is found in about 10% of healthy persons younger than 38 years of age and the amount of the bacteria increases with age. Most persons with these bacteria have no symptoms; however, it is thought that when these bacteria cause ulcers, antibiotic medications can be used to help eliminate or reduce the problem.  °PREPARATION FOR TEST °No preparation or fasting is necessary. °NORMAL FINDINGS °Negative (H-pylori bacteria not present) °Ranges for normal findingsmay vary among different laboratories and hospitals. You should always check with your doctor after having lab work or other tests done to discuss the meaning of your test results and whether or not your values are considered within normal limits. °MEANING OF TEST  °Your caregiver will go over the test results with you and discuss the importance and meaning of your results, as well as treatment options and the need for additional tests if necessary. °It is your responsibility to obtain your test results. Ask the lab or department performing the test when and how you will get your results. °OBTAINING THE TEST RESULTS °It is your responsibility to obtain your test results. Ask the lab or department performing the test when and how you will get your results. °Document Released: 07/06/2004 Document Revised: 09/04/2011 Document Reviewed: 05/23/2008 °[employer]® Patient Information ©2014 [employer]. ° °

## 2013-05-15 NOTE — Progress Notes (Signed)
Pt is here to establish. Oct 2 went to ER for nausea and diarrea and a bacterial infection in the stomach. Pt reports that he is feeling much better. The only issue is that he is not regular.

## 2013-07-10 ENCOUNTER — Encounter: Payer: Self-pay | Admitting: Internal Medicine

## 2014-01-01 ENCOUNTER — Emergency Department (INDEPENDENT_AMBULATORY_CARE_PROVIDER_SITE_OTHER)
Admission: EM | Admit: 2014-01-01 | Discharge: 2014-01-01 | Disposition: A | Discharge status: Home / Self Care | Payer: Self-pay | Source: Home / Self Care | Attending: Emergency Medicine | Admitting: Emergency Medicine

## 2014-01-01 ENCOUNTER — Encounter (HOSPITAL_COMMUNITY): Payer: Self-pay | Admitting: Emergency Medicine

## 2014-01-01 DIAGNOSIS — I10 Essential (primary) hypertension: Secondary | ICD-10-CM

## 2014-01-01 DIAGNOSIS — D45 Polycythemia vera: Secondary | ICD-10-CM

## 2014-01-01 DIAGNOSIS — R112 Nausea with vomiting, unspecified: Secondary | ICD-10-CM

## 2014-01-01 DIAGNOSIS — K5289 Other specified noninfective gastroenteritis and colitis: Secondary | ICD-10-CM

## 2014-01-01 DIAGNOSIS — A048 Other specified bacterial intestinal infections: Secondary | ICD-10-CM

## 2014-01-01 LAB — POCT I-STAT, CHEM 8
BUN: 5 mg/dL — ABNORMAL LOW (ref 6–23)
Calcium, Ion: 1.19 mmol/L (ref 1.12–1.23)
Chloride: 100 mEq/L (ref 96–112)
Creatinine, Ser: 0.9 mg/dL (ref 0.50–1.35)
Glucose, Bld: 98 mg/dL (ref 70–99)
HEMATOCRIT: 47 % (ref 39.0–52.0)
HEMOGLOBIN: 16 g/dL (ref 13.0–17.0)
POTASSIUM: 4.4 meq/L (ref 3.7–5.3)
SODIUM: 138 meq/L (ref 137–147)
TCO2: 29 mmol/L (ref 0–100)

## 2014-01-01 MED ORDER — ONDANSETRON 4 MG PO TBDP
ORAL_TABLET | ORAL | Status: AC
  Filled 2014-01-01: qty 2

## 2014-01-01 MED ORDER — SODIUM CHLORIDE 0.9 % IV BOLUS (SEPSIS)
1000.0000 mL | Freq: Once | INTRAVENOUS | Status: AC
Start: 2014-01-01 — End: 2014-01-01
  Administered 2014-01-01: 1000 mL via INTRAVENOUS

## 2014-01-01 MED ORDER — ONDANSETRON 8 MG PO TBDP: 8.0000 mg | ORAL_TABLET | Freq: Three times a day (TID) | ORAL | Status: DC | PRN

## 2014-01-01 MED ORDER — ONDANSETRON 4 MG PO TBDP
8.0000 mg | ORAL_TABLET | Freq: Once | ORAL | Status: AC
  Administered 2014-01-01: 8 mg via ORAL

## 2014-01-01 NOTE — Discharge Instructions (Signed)

## 2014-01-01 NOTE — ED Provider Notes (Signed)
CSN: [id number]     Arrival date & time 01/01/14  1036 History   First MD Initiated Contact with Patient 01/01/14 1108     Chief Complaint  Patient presents with  . Nausea   (Consider location/radiation/quality/duration/timing/severity/associated sxs/prior Treatment) HPI Comments: 38 year old male presents complaining of nausea and vomiting in the mornings for 3 days.  he wakes up in the morning and feels very nauseated and vomits, and starts to feel better by mid morning. Last night he also got sick in the evening. In the middle of the day he feels fine. He has not had much appetite but he can't eat food without vomiting. He has been drinking lots of fluids to keep from getting dehydrated. He denies any other symptoms including no abdominal pain, diarrhea, constipation. He does not drink alcohol. No rash. He has night sweats but thinks this is because he has a poor air-conditioner. No weight loss.   Past Medical History  Diagnosis Date  . Narcotic abuse    History reviewed. No pertinent past surgical history. History reviewed. No pertinent family history. History  Substance Use Topics  . Smoking status: Current Every Day Smoker  . Smokeless tobacco: Not on file  . Alcohol Use: 3.6 oz/week    6 Cans of beer per week     Comment: 4-6 beers/day    Review of Systems  Constitutional: Positive for diaphoresis. Negative for fever, chills, fatigue and unexpected weight change.  HENT: Negative for sore throat.   Eyes: Negative for visual disturbance.  Respiratory: Negative for cough and shortness of breath.   Cardiovascular: Negative for chest pain, palpitations and leg swelling.  Gastrointestinal: Positive for nausea and vomiting. Negative for abdominal pain, diarrhea, constipation and blood in stool.  Endocrine: Negative for polydipsia and polyuria.  Genitourinary: Negative for dysuria, urgency, frequency and hematuria.  Musculoskeletal: Negative for arthralgias, myalgias, neck pain and  neck stiffness.  Skin: Negative for rash.  Neurological: Negative for dizziness, weakness and light-headedness.  All other systems reviewed and are negative.   Allergies  Review of patient's allergies indicates no known allergies.  Home Medications   Prior to Admission medications   Medication Sig Start Date End Date Taking? Authorizing Provider  cloNIDine (CATAPRES) 0.2 MG tablet Take 0.5 tablets (0.1 mg total) by mouth 2 (two) times daily. 03/27/13   Blanchie Dessert, MD  ondansetron (ZOFRAN ODT) 8 MG disintegrating tablet Take 1 tablet (8 mg total) by mouth every 8 (eight) hours as needed for nausea or vomiting. 01/01/14   Freeman Caldron Akshaya Toepfer, PA-C   BP 129/87  Pulse 84  Temp(Src) 98.7 F (37.1 C) (Oral)  Resp 18  SpO2 100% Physical Exam  Nursing note and vitals reviewed. Constitutional: He is oriented to person, place, and time. He appears well-developed and well-nourished. No distress.  HENT:  Head: Normocephalic and atraumatic.  Right Ear: External ear normal.  Left Ear: External ear normal.  Nose: Nose normal.  Mouth/Throat: Oropharynx is clear and moist. No oropharyngeal exudate.  Neck: Normal range of motion. Neck supple.  Cardiovascular: Normal rate, regular rhythm and normal heart sounds.   Pulmonary/Chest: Effort normal and breath sounds normal. No respiratory distress.  Abdominal: Soft. Bowel sounds are normal. He exhibits no distension and no mass. There is tenderness (very mild) in the left upper quadrant. There is no rigidity, no rebound, no guarding, no tenderness at McBurney's point and negative Murphy's sign.  Lymphadenopathy:    He has no cervical adenopathy.  Neurological: He is alert and  oriented to person, place, and time. Coordination normal.  Skin: Skin is warm and dry. No rash noted. He is not diaphoretic.  Psychiatric: He has a normal mood and affect. Judgment normal.    ED Course  Procedures (including critical care time) Labs Review Labs Reviewed   POCT I-STAT, CHEM 8 - Abnormal; Notable for the following:    BUN 5 (*)    All other components within normal limits    Imaging Review No results found.   MDM   1. Non-intractable vomiting with nausea, vomiting of unspecified type    Given 1 L of IV fluids here for mild orthostatic hypotension. Will treat with Zofran, bland diet, increase fluids. Followup if symptoms not improving in 2 days  He is afebrile and nontoxic with normal vital signs and a benign exam, I do not suspect any serious illness at this time.  Meds ordered this encounter  Medications  . ondansetron (ZOFRAN-ODT) disintegrating tablet 8 mg    Sig:   . sodium chloride 0.9 % bolus 1,000 mL    Sig:   . ondansetron (ZOFRAN ODT) 8 MG disintegrating tablet    Sig: Take 1 tablet (8 mg total) by mouth every 8 (eight) hours as needed for nausea or vomiting.    Dispense:  12 tablet    Refill:  0    Order Specific Question:  Supervising Provider    Answer:  Jake Michaelis, DAVID C [[id number]]       Liam Graham, PA-C 01/01/14 1332

## 2014-01-01 NOTE — ED Notes (Signed)
Pt  Reports   Nausea    Vomiting   And  Weakness   X  sev  Days    Pt     denys  Any  Diarrhea    No  Appetite      History stomach infection  About  6  Months  Ago

## 2014-01-03 NOTE — ED Provider Notes (Signed)
Medical screening examination/treatment/procedure(s) were performed by non-physician practitioner and as supervising physician I was immediately available for consultation/collaboration.  Philipp Deputy, M.D.  Harden Mo, MD 01/03/14 [phone]

## 2016-03-18 ENCOUNTER — Emergency Department (HOSPITAL_COMMUNITY): Payer: Worker's Compensation

## 2016-03-18 ENCOUNTER — Encounter (HOSPITAL_COMMUNITY): Payer: Self-pay | Admitting: Emergency Medicine

## 2016-03-18 ENCOUNTER — Ambulatory Visit (HOSPITAL_COMMUNITY)
Admission: EM | Admit: 2016-03-18 | Discharge: 2016-03-18 | Disposition: A | Discharge status: Nursing Home (Includes ICF) | Payer: Self-pay | Attending: Internal Medicine | Admitting: Internal Medicine

## 2016-03-18 ENCOUNTER — Ambulatory Visit (INDEPENDENT_AMBULATORY_CARE_PROVIDER_SITE_OTHER): Payer: Self-pay

## 2016-03-18 ENCOUNTER — Encounter (HOSPITAL_COMMUNITY): Payer: Self-pay | Admitting: *Deleted

## 2016-03-18 ENCOUNTER — Emergency Department (HOSPITAL_COMMUNITY)
Admission: EM | Admit: 2016-03-18 | Discharge: 2016-03-18 | Disposition: A | Discharge status: Home / Self Care | Payer: Worker's Compensation | Attending: Emergency Medicine | Admitting: Emergency Medicine

## 2016-03-18 DIAGNOSIS — S82892A Other fracture of left lower leg, initial encounter for closed fracture: Secondary | ICD-10-CM

## 2016-03-18 DIAGNOSIS — S92065A Nondisplaced intraarticular fracture of left calcaneus, initial encounter for closed fracture: Secondary | ICD-10-CM | POA: Insufficient documentation

## 2016-03-18 DIAGNOSIS — S99912A Unspecified injury of left ankle, initial encounter: Secondary | ICD-10-CM | POA: Diagnosis present

## 2016-03-18 DIAGNOSIS — Y929 Unspecified place or not applicable: Secondary | ICD-10-CM | POA: Diagnosis not present

## 2016-03-18 DIAGNOSIS — W11XXXA Fall on and from ladder, initial encounter: Secondary | ICD-10-CM | POA: Diagnosis not present

## 2016-03-18 DIAGNOSIS — S92902A Unspecified fracture of left foot, initial encounter for closed fracture: Secondary | ICD-10-CM

## 2016-03-18 DIAGNOSIS — Y939 Activity, unspecified: Secondary | ICD-10-CM | POA: Diagnosis not present

## 2016-03-18 DIAGNOSIS — F17200 Nicotine dependence, unspecified, uncomplicated: Secondary | ICD-10-CM | POA: Insufficient documentation

## 2016-03-18 DIAGNOSIS — S99922A Unspecified injury of left foot, initial encounter: Secondary | ICD-10-CM

## 2016-03-18 DIAGNOSIS — Y999 Unspecified external cause status: Secondary | ICD-10-CM | POA: Insufficient documentation

## 2016-03-18 MED ORDER — HYDROCODONE-ACETAMINOPHEN 5-325 MG PO TABS
ORAL_TABLET | ORAL | Status: AC
  Filled 2016-03-18: qty 1

## 2016-03-18 MED ORDER — HYDROMORPHONE HCL 1 MG/ML IJ SOLN: 1.0000 mg | Freq: Once | INTRAMUSCULAR | Status: DC

## 2016-03-18 MED ORDER — KETOROLAC TROMETHAMINE 30 MG/ML IJ SOLN: 60.0000 mg | Freq: Once | INTRAMUSCULAR | Status: DC

## 2016-03-18 MED ORDER — HYDROCODONE-ACETAMINOPHEN 5-325 MG PO TABS
1.0000 | ORAL_TABLET | Freq: Once | ORAL | Status: AC
  Administered 2016-03-18: 1 via ORAL

## 2016-03-18 MED ORDER — HYDROCODONE-ACETAMINOPHEN 5-325 MG PO TABS: 1.0000 | ORAL_TABLET | ORAL | 0 refills | Status: DC | PRN

## 2016-03-18 NOTE — ED Notes (Signed)
CT notified pt will be going to room 44 when CT is complete

## 2016-03-18 NOTE — ED Notes (Signed)
Youlanda Roys, NP inspected wrap and stated it was ok and pt is good to go to ER for CT

## 2016-03-18 NOTE — ED Triage Notes (Signed)
Pt reports he was going up an extended ladder yest and states the ladder slipped and he fell down w/it about 8 ft and caught his left foot  Sx include: swelling and pain... Brought back on wheelchair.

## 2016-03-18 NOTE — ED Provider Notes (Signed)
CSN: [id number]     Arrival date & time 03/18/16  1256 History   First MD Initiated Contact with Patient 03/18/16 1456     Chief Complaint  Patient presents with  . Foot Injury   (Consider location/radiation/quality/duration/timing/severity/associated sxs/prior Treatment) 40 year old male states he fell from a ladder yesterday and during the fall he believes he got his left foot call in the latter. He is complaining of pain to the ankle. He states today he  developed swelling to the ankle and the foot. after the fall in which she denies other injury he states he was not able to put weight on the left foot and had to crawl. He has not been ambulatory. He points to the ankle as the source of pain although there is some swelling within the foot.      Past Medical History:  Diagnosis Date  . Narcotic abuse    History reviewed. No pertinent surgical history. History reviewed. No pertinent family history. Social History  Substance Use Topics  . Smoking status: Current Every Day Smoker  . Smokeless tobacco: Never Used  . Alcohol use 3.6 oz/week    6 Cans of beer per week     Comment: 4-6 beers/day    Review of Systems  Constitutional: Negative.   Respiratory: Negative.   Gastrointestinal: Negative.   Genitourinary: Negative.   Musculoskeletal: Positive for joint swelling. Negative for back pain.       As per HPI  Skin: Negative.   Neurological: Negative for dizziness, weakness, numbness and headaches.  All other systems reviewed and are negative.   Allergies  Review of patient's allergies indicates no known allergies.  Home Medications   Prior to Admission medications   Medication Sig Start Date End Date Taking? Authorizing Provider  cloNIDine (CATAPRES) 0.2 MG tablet Take 0.5 tablets (0.1 mg total) by mouth 2 (two) times daily. 03/27/13   Blanchie Dessert, MD  HYDROcodone-acetaminophen (NORCO/VICODIN) 5-325 MG tablet Take 1 tablet by mouth every 4 (four) hours as needed.  03/18/16   Janne Napoleon, NP  ondansetron (ZOFRAN ODT) 8 MG disintegrating tablet Take 1 tablet (8 mg total) by mouth every 8 (eight) hours as needed for nausea or vomiting. 01/01/14   Liam Graham, PA-C   Meds Ordered and Administered this Visit   Medications  HYDROcodone-acetaminophen (NORCO/VICODIN) 5-325 MG per tablet 1 tablet (1 tablet Oral Given 03/18/16 1518)    BP 122/85 (BP Location: Left Arm)   Pulse 73   Temp 98 F (36.7 C) (Oral)   Resp 16   SpO2 100%  No data found.   Physical Exam  Constitutional: He is oriented to person, place, and time. He appears well-developed and well-nourished.  HENT:  Head: Normocephalic and atraumatic.  Eyes: EOM are normal. Left eye exhibits no discharge.  Neck: Normal range of motion. Neck supple.  Musculoskeletal: He exhibits no deformity.  Tenderness to the soft tissues of the bilateral malleolar of the left ankle. Swelling to the foot. Minor tenderness to the foot. No deformities. Range of motion limited due to pain. Pedal pulses 1+. Normal color.  Neurological: He is alert and oriented to person, place, and time. No cranial nerve deficit.  Skin: Skin is warm and dry.  Psychiatric: He has a normal mood and affect.    Urgent Care Course   Clinical Course    Procedures (including critical care time)  Labs Review Labs Reviewed - No data to display  Imaging Review Dg Ankle Complete Left  Result  Date: 03/18/2016 CLINICAL DATA:  Patient states that he slipped off ladder and left foot/ankle became caught under ladder yesterday, non weight bearing. Diffuse swelling EXAM: LEFT ANKLE COMPLETE - 3+ VIEW COMPARISON:  None. FINDINGS: There is a comminuted calcaneal fracture. Fractures intra-articular come intersecting the anterior margin of the posterior facet of the subtalar joint. This portion of the fracture is depressed inferiorly. The fracture line extends to the plantar surface of the calcaneal body. Another fracture line extends to the  posterior margin of the calcaneus chest posterior to the posterior facet of subtalar joint. No ankle fractures. The ankle mortise is normally spaced and aligned. There is a small triangular-shaped bony fragment projecting along the medial tip of the distal fibula consistent with a comminuted fracture component from the calcaneus, likely at the insertion of the anterior calcaneal fibular ligament. There is diffuse surrounding soft tissue swelling. IMPRESSION: Comminuted, intra-articular, calcaneal fracture as described. Ankle joint is normally spaced and aligned. Electronically Signed   By: Lajean Manes M.D.   On: 03/18/2016 15:15     Visual Acuity Review  Right Eye Distance:   Left Eye Distance:   Bilateral Distance:    Right Eye Near:   Left Eye Near:    Bilateral Near:         MDM   1. Foot injury, left, initial encounter   2. Foot fracture, left, closed, initial encounter    Comminuted fracture of the calcaneus. Jones wrap Consulted with Dr. Tamera Punt orthopedist. Instructions were to apply bulky Jones wrap, crutches and no weightbearing. He requested the patient follow-up with Dr. Marcelino Scot orthopedist for repair. He also requested the patient have a CT exam now. The patient will be discharged and sent to the ED for the CT scan through the ED. The charge nurse Jacques Navy was advised and she stated she would help in trying to expedite the patient through x-ray. Keep your foot elevated. No weight bearing. Use crutches. Medications for pain. On Monday call the orthopedic surgeon listed on this page and make an appointment. Meds ordered this encounter  Medications  . HYDROcodone-acetaminophen (NORCO/VICODIN) 5-325 MG per tablet 1 tablet  . HYDROcodone-acetaminophen (NORCO/VICODIN) 5-325 MG tablet    Sig: Take 1 tablet by mouth every 4 (four) hours as needed.    Dispense:  20 tablet    Refill:  0    Order Specific Question:   Supervising Provider    Answer:   Sherlene Shams [id number]        Janne Napoleon, NP 03/18/16 1640    Janne Napoleon, NP 03/18/16 [place], NP 03/18/16 1645

## 2016-03-18 NOTE — ED Triage Notes (Signed)
The pt was sent here [facility] he fell off a ladder yesterday and he has a  Ankle fracture  The orthopedist that will be treating him has ordered a c-t of that lt ankle  Pt ambulating on crutches  Moved to a w/c    He was given vicodin  2 hours ago

## 2016-03-18 NOTE — Discharge Instructions (Signed)
Keep your foot elevated. No weight bearing. Use crutches. Medications for pain. On Monday call the orthopedic surgeon listed on this page and make an appointment.

## 2016-03-18 NOTE — ED Notes (Signed)
Patient transported to CT 

## 2016-03-18 NOTE — ED Notes (Signed)
Notified Youlanda Roys, NP that pt was driving but he called his friends to come and pick him up  Explained to pt why he shouldn't drive while taking Vicodin... Pt verb understanding and friend will come inside and pick him up.

## 2016-03-18 NOTE — ED Provider Notes (Signed)
[facility] DEPT Provider Note   CSN: [id number] Arrival date & time: 03/18/16  1708     History   Chief Complaint Chief Complaint  Patient presents with  . Joint Swelling    HPI Lawrence Mack is a 40 y.o. male.  40 year old male presents for urgent care to have a CT of his left ankle. Seen there today and does have a fracture and follow-up has been arranged. Patient complains of severe sharp pain worse with any movement to his left ankle has better with remaining still and being nonweightbearing. Denies any numbness or paresthesias to his toes.      Past Medical History:  Diagnosis Date  . Narcotic abuse     Patient Active Problem List   Diagnosis Date Noted  . H. pylori infection 05/15/2013    History reviewed. No pertinent surgical history.     Home Medications    Prior to Admission medications   Medication Sig Start Date End Date Taking? Authorizing Provider  cloNIDine (CATAPRES) 0.2 MG tablet Take 0.5 tablets (0.1 mg total) by mouth 2 (two) times daily. 03/27/13   Blanchie Dessert, MD  HYDROcodone-acetaminophen (NORCO/VICODIN) 5-325 MG tablet Take 1 tablet by mouth every 4 (four) hours as needed. 03/18/16   Janne Napoleon, NP  ondansetron (ZOFRAN ODT) 8 MG disintegrating tablet Take 1 tablet (8 mg total) by mouth every 8 (eight) hours as needed for nausea or vomiting. 01/01/14   Liam Graham, PA-C    Family History No family history on file.  Social History Social History  Substance Use Topics  . Smoking status: Current Every Day Smoker  . Smokeless tobacco: Never Used  . Alcohol use 3.6 oz/week    6 Cans of beer per week     Comment: 4-6 beers/day     Allergies   Review of patient's allergies indicates no known allergies.   Review of Systems Review of Systems  All other systems reviewed and are negative.    Physical Exam Updated Vital Signs BP 126/85   Pulse 77   Temp 98.5 F (36.9 C) (Oral)   Resp 18   SpO2 99%   Physical Exam    Constitutional: He is oriented to person, place, and time. He appears well-developed and well-nourished.  Non-toxic appearance.  HENT:  Head: Normocephalic and atraumatic.  Eyes: Conjunctivae are normal. Pupils are equal, round, and reactive to light.  Neck: Normal range of motion.  Cardiovascular: Normal rate.   Pulmonary/Chest: Effort normal.  Musculoskeletal:       Feet:  Neurological: He is alert and oriented to person, place, and time.  Skin: Skin is warm and dry.  Psychiatric: He has a normal mood and affect.  Nursing note and vitals reviewed.    ED Treatments / Results  Labs (all labs ordered are listed, but only abnormal results are displayed) Labs Reviewed - No data to display  EKG  EKG Interpretation None       Radiology Dg Ankle Complete Left  Result Date: 03/18/2016 CLINICAL DATA:  Patient states that he slipped off ladder and left foot/ankle became caught under ladder yesterday, non weight bearing. Diffuse swelling EXAM: LEFT ANKLE COMPLETE - 3+ VIEW COMPARISON:  None. FINDINGS: There is a comminuted calcaneal fracture. Fractures intra-articular come intersecting the anterior margin of the posterior facet of the subtalar joint. This portion of the fracture is depressed inferiorly. The fracture line extends to the plantar surface of the calcaneal body. Another fracture line extends to the posterior margin of  the calcaneus chest posterior to the posterior facet of subtalar joint. No ankle fractures. The ankle mortise is normally spaced and aligned. There is a small triangular-shaped bony fragment projecting along the medial tip of the distal fibula consistent with a comminuted fracture component from the calcaneus, likely at the insertion of the anterior calcaneal fibular ligament. There is diffuse surrounding soft tissue swelling. IMPRESSION: Comminuted, intra-articular, calcaneal fracture as described. Ankle joint is normally spaced and aligned. Electronically Signed    By: Lajean Manes M.D.   On: 03/18/2016 15:15    Procedures Procedures (including critical care time)  Medications Ordered in ED Medications  ketorolac (TORADOL) 30 MG/ML injection 60 mg (not administered)     Initial Impression / Assessment and Plan / ED Course  I have reviewed the triage vital signs and the nursing notes.  Pertinent labs & imaging results that were available during my care of the patient were reviewed by me and considered in my medical decision making (see chart for details).  Clinical Course    Patient ordered hydromorphone for pain but because he is driving he has deferred this medication. I ordered the patient Toradol and I went back to do a thorough exam of his foot and ankle but the patient has eloped. Prior to receiving his results of his CT scan.  Final Clinical Impressions(s) / ED Diagnoses   Final diagnoses:  None    New Prescriptions New Prescriptions   No medications on file     Lacretia Leigh, MD 03/18/16 2049

## 2016-04-03 ENCOUNTER — Encounter (HOSPITAL_COMMUNITY)
Admission: RE | Admit: 2016-04-03 | Discharge: 2016-04-03 | Disposition: A | Discharge status: Home / Self Care | Payer: Worker's Compensation | Source: Ambulatory Visit | Attending: Orthopedic Surgery | Admitting: Orthopedic Surgery

## 2016-04-03 ENCOUNTER — Encounter (HOSPITAL_COMMUNITY): Payer: Self-pay

## 2016-04-03 DIAGNOSIS — Z01818 Encounter for other preprocedural examination: Secondary | ICD-10-CM | POA: Diagnosis present

## 2016-04-03 HISTORY — DX: Pain in unspecified joint: M25.50

## 2016-04-03 LAB — SURGICAL PCR SCREEN
MRSA, PCR: NEGATIVE
Staphylococcus aureus: NEGATIVE

## 2016-04-03 LAB — CBC WITH DIFFERENTIAL/PLATELET
BASOS ABS: 0.1 10*3/uL (ref 0.0–0.1)
BASOS PCT: 1 %
EOS PCT: 3 %
Eosinophils Absolute: 0.3 10*3/uL (ref 0.0–0.7)
HCT: 52.9 % — ABNORMAL HIGH (ref 39.0–52.0)
Hemoglobin: 18.7 g/dL — ABNORMAL HIGH (ref 13.0–17.0)
Lymphocytes Relative: 25 %
Lymphs Abs: 2.5 10*3/uL (ref 0.7–4.0)
MCH: 36.4 pg — ABNORMAL HIGH (ref 26.0–34.0)
MCHC: 35.3 g/dL (ref 30.0–36.0)
MCV: 102.9 fL — ABNORMAL HIGH (ref 78.0–100.0)
MONO ABS: 0.9 10*3/uL (ref 0.1–1.0)
Monocytes Relative: 9 %
Neutro Abs: 6.3 10*3/uL (ref 1.7–7.7)
Neutrophils Relative %: 62 %
PLATELETS: 191 10*3/uL (ref 150–400)
RBC: 5.14 MIL/uL (ref 4.22–5.81)
RDW: 11.6 % (ref 11.5–15.5)
WBC: 10 10*3/uL (ref 4.0–10.5)

## 2016-04-03 LAB — COMPREHENSIVE METABOLIC PANEL
ALBUMIN: 3.9 g/dL (ref 3.5–5.0)
ALT: 14 U/L — ABNORMAL LOW (ref 17–63)
AST: 28 U/L (ref 15–41)
Alkaline Phosphatase: 72 U/L (ref 38–126)
Anion gap: 8 (ref 5–15)
CHLORIDE: 102 mmol/L (ref 101–111)
CO2: 29 mmol/L (ref 22–32)
Calcium: 9.2 mg/dL (ref 8.9–10.3)
Creatinine, Ser: 0.87 mg/dL (ref 0.61–1.24)
GFR calc Af Amer: >60 mL/min (ref >60)
GFR calc non Af Amer: >60 mL/min (ref >60)
GLUCOSE: 89 mg/dL (ref 65–99)
POTASSIUM: 3.7 mmol/L (ref 3.5–5.1)
SODIUM: 139 mmol/L (ref 135–145)
Total Bilirubin: 0.7 mg/dL (ref 0.3–1.2)
Total Protein: 6.6 g/dL (ref 6.5–8.1)

## 2016-04-03 LAB — PROTIME-INR
INR: 1.03
Prothrombin Time: 13.5 s (ref 11.4–15.2)

## 2016-04-03 LAB — APTT: aPTT: 29 s (ref 24–36)

## 2016-04-03 MED ORDER — CHLORHEXIDINE GLUCONATE 4 % EX LIQD: 60.0000 mL | Freq: Once | CUTANEOUS | Status: DC

## 2016-04-03 NOTE — Progress Notes (Signed)
Pt unable to provide urine sample at PAT appointment. Will need UA on arrival.

## 2016-04-03 NOTE — H&P (Signed)
[facility] H&P  Chief Complaint: L calcaneus fracture HPI:   40 y/o male sustained a L calcaneus fracture on 03/18/2016 after falling off ladder. He has been seen at our office for full evaluation. Pt presents for ORIF of his left calcaneus. Pt smokes about 1 PPD. No other significant medical history   Past Medical History:  Diagnosis Date  . Joint pain     Past Surgical History:  Procedure Laterality Date  . MOUTH SURGERY      No family history on file. Social History:  reports that he has been smoking.  He has a 22.00 pack-year smoking history. He has never used smokeless tobacco. He reports that he drinks about 3.6 oz of alcohol per week . He reports that he uses drugs, including Marijuana.  Allergies:  Allergies  Allergen Reactions  . No Known Allergies    Medications  Percocet 5/325   Results for orders placed or performed during the hospital encounter of 04/03/16 (from the past 48 hour(s))  Surgical pcr screen     Status: None   Collection Time: 04/03/16  1:17 PM  Result Value Ref Range   MRSA, PCR NEGATIVE NEGATIVE   Staphylococcus aureus NEGATIVE NEGATIVE    Comment:        The Xpert SA Assay (FDA approved for NASAL specimens in patients over 87 years of age), is one component of a comprehensive surveillance program.  Test performance has been validated by [facility] for patients greater than or equal to 2 year old. It is not intended to diagnose infection nor to guide or monitor treatment.   APTT     Status: None   Collection Time: 04/03/16  1:17 PM  Result Value Ref Range   aPTT 29 24 - 36 seconds  CBC WITH DIFFERENTIAL     Status: Abnormal   Collection Time: 04/03/16  1:17 PM  Result Value Ref Range   WBC 10.0 4.0 - 10.5 K/uL   RBC 5.14 4.22 - 5.81 MIL/uL   Hemoglobin 18.7 (H) 13.0 - 17.0 g/dL   HCT 52.9 (H) 39.0 - 52.0 %   MCV 102.9 (H) 78.0 - 100.0 fL   MCH 36.4 (H) 26.0 - 34.0 pg   MCHC 35.3 30.0 - 36.0 g/dL   RDW 11.6  11.5 - 15.5 %   Platelets 191 150 - 400 K/uL   Neutrophils Relative % 62 %   Neutro Abs 6.3 1.7 - 7.7 K/uL   Lymphocytes Relative 25 %   Lymphs Abs 2.5 0.7 - 4.0 K/uL   Monocytes Relative 9 %   Monocytes Absolute 0.9 0.1 - 1.0 K/uL   Eosinophils Relative 3 %   Eosinophils Absolute 0.3 0.0 - 0.7 K/uL   Basophils Relative 1 %   Basophils Absolute 0.1 0.0 - 0.1 K/uL  Comprehensive metabolic panel     Status: Abnormal   Collection Time: 04/03/16  1:17 PM  Result Value Ref Range   Sodium 139 135 - 145 mmol/L   Potassium 3.7 3.5 - 5.1 mmol/L   Chloride 102 101 - 111 mmol/L   CO2 29 22 - 32 mmol/L   Glucose, Bld 89 65 - 99 mg/dL   BUN <5 (L) 6 - 20 mg/dL   Creatinine, Ser 0.87 0.61 - 1.24 mg/dL   Calcium 9.2 8.9 - 10.3 mg/dL   Total Protein 6.6 6.5 - 8.1 g/dL   Albumin 3.9 3.5 - 5.0 g/dL   AST 28 15 - 41 U/L   ALT  14 (L) 17 - 63 U/L   Alkaline Phosphatase 72 38 - 126 U/L   Total Bilirubin 0.7 0.3 - 1.2 mg/dL   GFR calc non Af Amer >60 >60 mL/min   GFR calc Af Amer >60 >60 mL/min    Comment: (NOTE) The eGFR has been calculated using the CKD EPI equation. This calculation has not been validated in all clinical situations. eGFR's persistently <60 mL/min signify possible Chronic Kidney Disease.    Anion gap 8 5 - 15  Protime-INR     Status: None   Collection Time: 04/03/16  1:17 PM  Result Value Ref Range   Prothrombin Time 13.5 11.4 - 15.2 seconds   INR 1.03    No results found.  Review of Systems  Constitutional: Negative for fever.  Respiratory: Negative for shortness of breath.   Cardiovascular: Negative for chest pain and palpitations.  Gastrointestinal: Negative for abdominal pain, nausea and vomiting.  Genitourinary: Negative for dysuria.  Musculoskeletal:       Left foot pain   Neurological: Negative for tingling, sensory change and headaches.   Vitals to be obtained on arrival to short stay  Physical Exam  Constitutional: He is cooperative.  Pleasant white  male, NAD   Cardiovascular: Normal rate, regular rhythm, S1 normal and S2 normal.   Respiratory:  Decreased at the bases but o/w clear   GI:  + BS, NTND  Musculoskeletal:  Left Foot   Swelling has improved tremendously   Distal motor and sensory functions intact    Ext warm    + DP pulse    Skin wrinkles laterally with gentle compression   Neurological: He is alert.     Assessment/Plan  40 y/o male s/p fall with comminuted L calcaneus fracture  - comminuted L calcaneus fracture    OR for ORIF L calcaneus    NWB x 8 weeks    Admit post op for pain control and therapy      - Nicotine dependence    We have reviewed on several occassions the importance of nicotine cessation to increase his chances of healing as well as decreasing his chances of developing a deep infection   Jari Pigg, PA-C [facility] [phone] (P) 04/03/2016, 9:48 PM

## 2016-04-03 NOTE — Pre-Procedure Instructions (Signed)
Lawrence Mack  04/03/2016      [id number] - [place] [place] [id number] [place] [id number] Phone: [phone] Fax: [phone]  [id number] [place], [place]. [place] [address] Phone: [phone] Fax: [phone]    Your procedure is scheduled on Tues, Oct 10 @ 8:00 AM  Report to [facility] Admitting at 6:00 AM  Call this number if you have problems the morning of surgery:  [phone]   Remember:  Do not eat food or drink liquids after midnight.  Take these medicines the morning of surgery with A SIP OF WATER Pain Pill(if needed)             No Goody's,BC's,Aleve,Advil,Motrin,Ibuprofen,Fish Oil,or any Herbal Medications.    Do not wear jewelry.  Do not wear lotions, powders,colognes, or deoderant.  Men may shave face and neck.  Do not bring valuables to the hospital.  [facility] is not responsible for any belongings or valuables.  Contacts, dentures or bridgework may not be worn into surgery.  Leave your suitcase in the car.  After surgery it may be brought to your room.  For patients admitted to the hospital, discharge time will be determined by your treatment team.  Patients discharged the day of surgery will not be allowed to drive home.    [facility]  Before surgery, you can play an important role.  Because skin is not sterile, your skin needs to be as free of germs as possible.  You can reduce the number of germs on you skin by washing with CHG (chlorahexidine gluconate) soap before surgery.  CHG is an antiseptic cleaner which kills germs and bonds with the skin to continue killing germs even after washing.  Please DO NOT use if you have an allergy to CHG or antibacterial soaps.  If your skin becomes reddened/irritated stop using the CHG and inform your nurse when you arrive  at Short Stay.  Do not shave (including legs and underarms) for at least 48 hours prior to the first CHG shower.  You may shave your face.  Please follow these instructions carefully:   1.  Shower with CHG Soap the night before surgery and the                                morning of Surgery.  2.  If you choose to wash your hair, wash your hair first as usual with your       normal shampoo.  3.  After you shampoo, rinse your hair and body thoroughly to remove the                      Shampoo.  4.  Use CHG as you would any other liquid soap.  You can apply chg directly       to the skin and wash gently with scrungie or a clean washcloth.  5.  Apply the CHG Soap to your body ONLY FROM THE NECK DOWN.        Do not use on open wounds or open sores.  Avoid contact with your eyes,       ears, mouth and genitals (private parts).  Wash genitals (private parts)       with your normal soap.  6.  Wash thoroughly, paying special attention to the area where your surgery        will be performed.  7.  Thoroughly rinse your body with warm water from the neck down.  8.  DO NOT shower/wash with your normal soap after using and rinsing off       the CHG Soap.  9.  Pat yourself dry with a clean towel.            10.  Wear clean pajamas.            11.  Place clean sheets on your bed the night of your first shower and do not        sleep with pets.  Day of Surgery  Do not apply any lotions/deoderants the morning of surgery.  Please wear clean clothes to the hospital/surgery center.    Please read over the following fact sheets that you were given. Pain Booklet, Coughing and Deep Breathing and Surgical Site Infection Prevention

## 2016-04-03 NOTE — Progress Notes (Signed)
Cardiologist denies  Medical Md denies  Echo/stress test/heart cath denies  EKG/CXR denies in past yr

## 2016-04-04 ENCOUNTER — Encounter (HOSPITAL_COMMUNITY): Payer: Self-pay | Admitting: Anesthesiology

## 2016-04-04 ENCOUNTER — Inpatient Hospital Stay (HOSPITAL_COMMUNITY): Payer: Worker's Compensation

## 2016-04-04 ENCOUNTER — Inpatient Hospital Stay (HOSPITAL_COMMUNITY): Payer: Worker's Compensation | Admitting: Anesthesiology

## 2016-04-04 ENCOUNTER — Inpatient Hospital Stay (HOSPITAL_COMMUNITY)
Admission: RE | Admit: 2016-04-04 | Discharge: 2016-04-05 | DRG: 505 | Disposition: A | Discharge status: Home / Self Care | Payer: Worker's Compensation | Source: Ambulatory Visit | Attending: Orthopedic Surgery | Admitting: Orthopedic Surgery

## 2016-04-04 ENCOUNTER — Encounter (HOSPITAL_COMMUNITY)
Admission: RE | Disposition: A | Discharge status: Home / Self Care | Payer: Self-pay | Source: Ambulatory Visit | Attending: Orthopedic Surgery

## 2016-04-04 DIAGNOSIS — F17200 Nicotine dependence, unspecified, uncomplicated: Secondary | ICD-10-CM | POA: Diagnosis present

## 2016-04-04 DIAGNOSIS — W11XXXA Fall on and from ladder, initial encounter: Secondary | ICD-10-CM | POA: Diagnosis present

## 2016-04-04 DIAGNOSIS — Z419 Encounter for procedure for purposes other than remedying health state, unspecified: Secondary | ICD-10-CM

## 2016-04-04 DIAGNOSIS — S92062A Displaced intraarticular fracture of left calcaneus, initial encounter for closed fracture: Principal | ICD-10-CM | POA: Diagnosis present

## 2016-04-04 HISTORY — DX: Nicotine dependence, unspecified, uncomplicated: F17.200

## 2016-04-04 HISTORY — PX: ORIF CALCANEOUS FRACTURE: SHX5030

## 2016-04-04 LAB — PHOSPHORUS: PHOSPHORUS: 4.1 mg/dL (ref 2.5–4.6)

## 2016-04-04 LAB — URINALYSIS, ROUTINE W REFLEX MICROSCOPIC
BILIRUBIN URINE: NEGATIVE
Glucose, UA: NEGATIVE mg/dL
Hgb urine dipstick: NEGATIVE
Ketones, ur: NEGATIVE mg/dL
LEUKOCYTES UA: NEGATIVE
NITRITE: NEGATIVE
PH: 5.5 (ref 5.0–8.0)
Protein, ur: NEGATIVE mg/dL
SPECIFIC GRAVITY, URINE: 1.015 (ref 1.005–1.030)

## 2016-04-04 LAB — CBC
HCT: 45.6 % (ref 39.0–52.0)
HEMOGLOBIN: 15.6 g/dL (ref 13.0–17.0)
MCH: 34.4 pg — AB (ref 26.0–34.0)
MCHC: 34.2 g/dL (ref 30.0–36.0)
MCV: 100.4 fL — AB (ref 78.0–100.0)
Platelets: 170 10*3/uL (ref 150–400)
RBC: 4.54 MIL/uL (ref 4.22–5.81)
RDW: 11.5 % (ref 11.5–15.5)
WBC: 11 10*3/uL — ABNORMAL HIGH (ref 4.0–10.5)

## 2016-04-04 LAB — CREATININE, SERUM
CREATININE: 0.8 mg/dL (ref 0.61–1.24)
GFR calc Af Amer: >60 mL/min (ref >60)
GFR calc non Af Amer: >60 mL/min (ref >60)

## 2016-04-04 LAB — MAGNESIUM: Magnesium: 1.7 mg/dL (ref 1.7–2.4)

## 2016-04-04 SURGERY — OPEN REDUCTION INTERNAL FIXATION (ORIF) CALCANEOUS FRACTURE
Anesthesia: General | Site: Foot | Laterality: Left

## 2016-04-04 MED ORDER — MAGNESIUM CITRATE PO SOLN: 1.0000 | Freq: Once | ORAL | Status: DC | PRN

## 2016-04-04 MED ORDER — DEXTROSE 5 % IV SOLN
INTRAVENOUS | Status: DC | PRN
  Administered 2016-04-04: 08:00:00 via INTRAVENOUS

## 2016-04-04 MED ORDER — SUGAMMADEX SODIUM 200 MG/2ML IV SOLN
INTRAVENOUS | Status: DC | PRN
  Administered 2016-04-04: 200 mg via INTRAVENOUS

## 2016-04-04 MED ORDER — CEFAZOLIN SODIUM-DEXTROSE 2-4 GM/100ML-% IV SOLN
2.0000 g | INTRAVENOUS | Status: AC
  Administered 2016-04-04: 2 g via INTRAVENOUS

## 2016-04-04 MED ORDER — ONDANSETRON HCL 4 MG/2ML IJ SOLN
INTRAMUSCULAR | Status: AC
  Filled 2016-04-04: qty 2

## 2016-04-04 MED ORDER — FENTANYL CITRATE (PF) 100 MCG/2ML IJ SOLN
25.0000 ug | INTRAMUSCULAR | Status: DC | PRN
  Administered 2016-04-04 (×2): 50 ug via INTRAVENOUS

## 2016-04-04 MED ORDER — ROCURONIUM BROMIDE 100 MG/10ML IV SOLN
INTRAVENOUS | Status: DC | PRN
Start: 2016-04-04 — End: 2016-04-04
  Administered 2016-04-04: 20 mg via INTRAVENOUS
  Administered 2016-04-04: 50 mg via INTRAVENOUS

## 2016-04-04 MED ORDER — CEFAZOLIN SODIUM-DEXTROSE 2-4 GM/100ML-% IV SOLN
INTRAVENOUS | Status: AC
  Filled 2016-04-04: qty 100

## 2016-04-04 MED ORDER — ACETAMINOPHEN 500 MG PO TABS
1000.0000 mg | ORAL_TABLET | Freq: Once | ORAL | Status: AC
  Administered 2016-04-04: 1000 mg via ORAL

## 2016-04-04 MED ORDER — LACTATED RINGERS IV SOLN: INTRAVENOUS | Status: DC

## 2016-04-04 MED ORDER — LIDOCAINE HCL (CARDIAC) 20 MG/ML IV SOLN
INTRAVENOUS | Status: DC | PRN
  Administered 2016-04-04: 50 mg via INTRAVENOUS
  Administered 2016-04-04: 100 mg via INTRAVENOUS

## 2016-04-04 MED ORDER — ROCURONIUM BROMIDE 10 MG/ML (PF) SYRINGE
PREFILLED_SYRINGE | INTRAVENOUS | Status: AC
  Filled 2016-04-04: qty 10

## 2016-04-04 MED ORDER — MIDAZOLAM HCL 5 MG/5ML IJ SOLN
INTRAMUSCULAR | Status: DC | PRN
  Administered 2016-04-04: 2 mg via INTRAVENOUS

## 2016-04-04 MED ORDER — ONDANSETRON HCL 4 MG PO TABS: 4.0000 mg | ORAL_TABLET | Freq: Four times a day (QID) | ORAL | Status: DC | PRN

## 2016-04-04 MED ORDER — FENTANYL CITRATE (PF) 100 MCG/2ML IJ SOLN
INTRAMUSCULAR | Status: AC
  Filled 2016-04-04: qty 2

## 2016-04-04 MED ORDER — ACETAMINOPHEN 500 MG PO TABS
ORAL_TABLET | ORAL | Status: AC
  Administered 2016-04-04: 1000 mg via ORAL
  Filled 2016-04-04: qty 2

## 2016-04-04 MED ORDER — BUPIVACAINE-EPINEPHRINE (PF) 0.5% -1:200000 IJ SOLN
INTRAMUSCULAR | Status: DC | PRN
  Administered 2016-04-04: 30 mL via PERINEURAL

## 2016-04-04 MED ORDER — MEPERIDINE HCL 25 MG/ML IJ SOLN: 6.2500 mg | INTRAMUSCULAR | Status: DC | PRN

## 2016-04-04 MED ORDER — ONDANSETRON HCL 4 MG/2ML IJ SOLN: 4.0000 mg | Freq: Four times a day (QID) | INTRAMUSCULAR | Status: DC | PRN

## 2016-04-04 MED ORDER — DEXAMETHASONE SODIUM PHOSPHATE 10 MG/ML IJ SOLN
INTRAMUSCULAR | Status: DC | PRN
  Administered 2016-04-04: 10 mg via INTRAVENOUS

## 2016-04-04 MED ORDER — OXYCODONE HCL 5 MG PO TABS
5.0000 mg | ORAL_TABLET | ORAL | Status: DC | PRN
  Administered 2016-04-05 (×2): 10 mg via ORAL
  Filled 2016-04-04 (×2): qty 2

## 2016-04-04 MED ORDER — LACTATED RINGERS IV SOLN
INTRAVENOUS | Status: DC | PRN
  Administered 2016-04-04 (×2): via INTRAVENOUS

## 2016-04-04 MED ORDER — PHENYLEPHRINE 40 MCG/ML (10ML) SYRINGE FOR IV PUSH (FOR BLOOD PRESSURE SUPPORT)
PREFILLED_SYRINGE | INTRAVENOUS | Status: AC
  Filled 2016-04-04: qty 10

## 2016-04-04 MED ORDER — DOCUSATE SODIUM 100 MG PO CAPS
100.0000 mg | ORAL_CAPSULE | Freq: Two times a day (BID) | ORAL | Status: DC
  Administered 2016-04-04 – 2016-04-05 (×3): 100 mg via ORAL
  Filled 2016-04-04 (×3): qty 1

## 2016-04-04 MED ORDER — GABAPENTIN 300 MG PO CAPS
ORAL_CAPSULE | ORAL | Status: AC
  Administered 2016-04-04: 300 mg via ORAL
  Filled 2016-04-04: qty 1

## 2016-04-04 MED ORDER — LIDOCAINE 2% (20 MG/ML) 5 ML SYRINGE
INTRAMUSCULAR | Status: AC
  Filled 2016-04-04: qty 5

## 2016-04-04 MED ORDER — HYDROMORPHONE HCL 1 MG/ML IJ SOLN
1.0000 mg | INTRAMUSCULAR | Status: DC | PRN
  Administered 2016-04-04: 1 mg via INTRAVENOUS
  Filled 2016-04-04: qty 1

## 2016-04-04 MED ORDER — 0.9 % SODIUM CHLORIDE (POUR BTL) OPTIME
TOPICAL | Status: DC | PRN
  Administered 2016-04-04: 1000 mL

## 2016-04-04 MED ORDER — ACETAMINOPHEN 650 MG RE SUPP: 650.0000 mg | Freq: Four times a day (QID) | RECTAL | Status: DC | PRN

## 2016-04-04 MED ORDER — BISACODYL 5 MG PO TBEC: 5.0000 mg | DELAYED_RELEASE_TABLET | Freq: Every day | ORAL | Status: DC | PRN

## 2016-04-04 MED ORDER — ARTIFICIAL TEARS OP OINT
TOPICAL_OINTMENT | OPHTHALMIC | Status: DC | PRN
  Administered 2016-04-04: 1 via OPHTHALMIC

## 2016-04-04 MED ORDER — EPHEDRINE 5 MG/ML INJ
INTRAVENOUS | Status: AC
  Filled 2016-04-04: qty 10

## 2016-04-04 MED ORDER — FENTANYL CITRATE (PF) 100 MCG/2ML IJ SOLN
INTRAMUSCULAR | Status: AC
  Filled 2016-04-04: qty 4

## 2016-04-04 MED ORDER — OXYCODONE-ACETAMINOPHEN 5-325 MG PO TABS
1.0000 | ORAL_TABLET | Freq: Four times a day (QID) | ORAL | Status: DC | PRN
  Administered 2016-04-04 – 2016-04-05 (×3): 2 via ORAL
  Filled 2016-04-04 (×3): qty 2

## 2016-04-04 MED ORDER — METOCLOPRAMIDE HCL 5 MG/ML IJ SOLN: 10.0000 mg | Freq: Once | INTRAMUSCULAR | Status: DC | PRN

## 2016-04-04 MED ORDER — POTASSIUM CHLORIDE IN NACL 20-0.9 MEQ/L-% IV SOLN
INTRAVENOUS | Status: DC
  Administered 2016-04-04: 16:00:00 via INTRAVENOUS
  Filled 2016-04-04 (×2): qty 1000

## 2016-04-04 MED ORDER — METOCLOPRAMIDE HCL 5 MG PO TABS: 5.0000 mg | ORAL_TABLET | Freq: Three times a day (TID) | ORAL | Status: DC | PRN

## 2016-04-04 MED ORDER — ONDANSETRON HCL 4 MG/2ML IJ SOLN
INTRAMUSCULAR | Status: DC | PRN
  Administered 2016-04-04: 4 mg via INTRAVENOUS

## 2016-04-04 MED ORDER — ARTIFICIAL TEARS OP OINT
TOPICAL_OINTMENT | OPHTHALMIC | Status: AC
  Filled 2016-04-04: qty 3.5

## 2016-04-04 MED ORDER — CEFAZOLIN IN D5W 1 GM/50ML IV SOLN
1.0000 g | Freq: Four times a day (QID) | INTRAVENOUS | Status: AC
  Administered 2016-04-04 – 2016-04-05 (×3): 1 g via INTRAVENOUS
  Filled 2016-04-04 (×4): qty 50

## 2016-04-04 MED ORDER — SUGAMMADEX SODIUM 200 MG/2ML IV SOLN
INTRAVENOUS | Status: AC
  Filled 2016-04-04: qty 2

## 2016-04-04 MED ORDER — FENTANYL CITRATE (PF) 100 MCG/2ML IJ SOLN
INTRAMUSCULAR | Status: DC | PRN
  Administered 2016-04-04: 50 ug via INTRAVENOUS
  Administered 2016-04-04 (×3): 100 ug via INTRAVENOUS
  Administered 2016-04-04: 50 ug via INTRAVENOUS
  Administered 2016-04-04: 100 ug via INTRAVENOUS

## 2016-04-04 MED ORDER — PROPOFOL 10 MG/ML IV BOLUS
INTRAVENOUS | Status: DC | PRN
  Administered 2016-04-04: 200 mg via INTRAVENOUS

## 2016-04-04 MED ORDER — GABAPENTIN 300 MG PO CAPS
300.0000 mg | ORAL_CAPSULE | Freq: Once | ORAL | Status: AC
  Administered 2016-04-04: 300 mg via ORAL

## 2016-04-04 MED ORDER — MIDAZOLAM HCL 2 MG/2ML IJ SOLN
INTRAMUSCULAR | Status: AC
  Filled 2016-04-04: qty 2

## 2016-04-04 MED ORDER — METHOCARBAMOL 500 MG PO TABS
500.0000 mg | ORAL_TABLET | Freq: Four times a day (QID) | ORAL | Status: DC | PRN
  Administered 2016-04-05: 500 mg via ORAL
  Filled 2016-04-04: qty 1

## 2016-04-04 MED ORDER — PROPOFOL 10 MG/ML IV BOLUS
INTRAVENOUS | Status: AC
  Filled 2016-04-04: qty 40

## 2016-04-04 MED ORDER — METHOCARBAMOL 1000 MG/10ML IJ SOLN
1000.0000 mg | Freq: Four times a day (QID) | INTRAVENOUS | Status: DC | PRN
  Filled 2016-04-04: qty 10

## 2016-04-04 MED ORDER — DEXAMETHASONE SODIUM PHOSPHATE 10 MG/ML IJ SOLN
INTRAMUSCULAR | Status: AC
  Filled 2016-04-04: qty 1

## 2016-04-04 MED ORDER — ACETAMINOPHEN 325 MG PO TABS
650.0000 mg | ORAL_TABLET | Freq: Four times a day (QID) | ORAL | Status: DC | PRN
Start: 2016-04-04 — End: 2016-04-05

## 2016-04-04 MED ORDER — ENOXAPARIN SODIUM 40 MG/0.4ML ~~LOC~~ SOLN
40.0000 mg | SUBCUTANEOUS | Status: DC
  Administered 2016-04-05: 40 mg via SUBCUTANEOUS
  Filled 2016-04-04: qty 0.4

## 2016-04-04 MED ORDER — POLYETHYLENE GLYCOL 3350 17 G PO PACK
17.0000 g | PACK | Freq: Every day | ORAL | Status: DC
  Administered 2016-04-04 – 2016-04-05 (×2): 17 g via ORAL
  Filled 2016-04-04 (×2): qty 1

## 2016-04-04 MED ORDER — METOCLOPRAMIDE HCL 5 MG/ML IJ SOLN: 5.0000 mg | Freq: Three times a day (TID) | INTRAMUSCULAR | Status: DC | PRN

## 2016-04-04 SURGICAL SUPPLY — 73 items
BANDAGE ACE 4X5 VEL STRL LF (GAUZE/BANDAGES/DRESSINGS) ×3 IMPLANT
BANDAGE ACE 6X5 VEL STRL LF (GAUZE/BANDAGES/DRESSINGS) ×3 IMPLANT
BANDAGE ESMARK 6X9 LF (GAUZE/BANDAGES/DRESSINGS) ×1 IMPLANT
BIT DRILL 2.5X2.75 QC CALB (BIT) ×3 IMPLANT
BIT DRILL 3.5X5.5 QC CALB (BIT) ×3 IMPLANT
BIT DRILL CALIBRATED 2.7 (BIT) ×2 IMPLANT
BIT DRILL CALIBRATED 2.7MM (BIT) ×1
BLADE SURG 10 STRL SS (BLADE) IMPLANT
BNDG COHESIVE 4X5 TAN STRL (GAUZE/BANDAGES/DRESSINGS) IMPLANT
BNDG ESMARK 6X9 LF (GAUZE/BANDAGES/DRESSINGS) ×3
BNDG GAUZE ELAST 4 BULKY (GAUZE/BANDAGES/DRESSINGS) ×3 IMPLANT
BRUSH SCRUB DISP (MISCELLANEOUS) ×6 IMPLANT
COVER MAYO STAND STRL (DRAPES) IMPLANT
COVER SURGICAL LIGHT HANDLE (MISCELLANEOUS) ×3 IMPLANT
DRAIN TLS ROUND 10FR (DRAIN) ×3 IMPLANT
DRAPE C-ARM 42X72 X-RAY (DRAPES) ×6 IMPLANT
DRAPE C-ARMOR (DRAPES) ×3 IMPLANT
DRAPE ORTHO SPLIT 77X108 STRL (DRAPES)
DRAPE SURG ORHT 6 SPLT 77X108 (DRAPES) IMPLANT
DRAPE U-SHAPE 47X51 STRL (DRAPES) IMPLANT
DRSG ADAPTIC 3X8 NADH LF (GAUZE/BANDAGES/DRESSINGS) ×3 IMPLANT
DRSG EMULSION OIL 3X3 NADH (GAUZE/BANDAGES/DRESSINGS) IMPLANT
DRSG PAD ABDOMINAL 8X10 ST (GAUZE/BANDAGES/DRESSINGS) ×3 IMPLANT
ELECT REM PT RETURN 9FT ADLT (ELECTROSURGICAL) ×3
ELECTRODE REM PT RTRN 9FT ADLT (ELECTROSURGICAL) ×1 IMPLANT
GAUZE SPONGE 4X4 12PLY STRL (GAUZE/BANDAGES/DRESSINGS) ×3 IMPLANT
GLOVE BIO SURGEON STRL SZ7.5 (GLOVE) ×6 IMPLANT
GLOVE BIO SURGEON STRL SZ8 (GLOVE) ×6 IMPLANT
GLOVE BIOGEL PI IND STRL 7.5 (GLOVE) ×1 IMPLANT
GLOVE BIOGEL PI INDICATOR 7.5 (GLOVE) ×2
GOWN STRL REUS W/ TWL LRG LVL3 (GOWN DISPOSABLE) ×2 IMPLANT
GOWN STRL REUS W/ TWL XL LVL3 (GOWN DISPOSABLE) ×1 IMPLANT
GOWN STRL REUS W/TWL LRG LVL3 (GOWN DISPOSABLE) ×4
GOWN STRL REUS W/TWL XL LVL3 (GOWN DISPOSABLE) ×2
K-WIRE ACE 1.6X6 (WIRE) ×12
KIT BASIN OR (CUSTOM PROCEDURE TRAY) ×3 IMPLANT
KIT ROOM TURNOVER OR (KITS) ×3 IMPLANT
KWIRE ACE 1.6X6 (WIRE) ×4 IMPLANT
MANIFOLD NEPTUNE II (INSTRUMENTS) IMPLANT
NEEDLE HYPO 21X1.5 SAFETY (NEEDLE) ×3 IMPLANT
NS IRRIG 1000ML POUR BTL (IV SOLUTION) ×3 IMPLANT
PACK ORTHO EXTREMITY (CUSTOM PROCEDURE TRAY) ×3 IMPLANT
PAD ARMBOARD 7.5X6 YLW CONV (MISCELLANEOUS) ×6 IMPLANT
PAD CAST 4YDX4 CTTN HI CHSV (CAST SUPPLIES) ×1 IMPLANT
PADDING CAST COTTON 4X4 STRL (CAST SUPPLIES) ×2
PADDING CAST COTTON 6X4 STRL (CAST SUPPLIES) ×3 IMPLANT
PENCIL BUTTON HOLSTER BLD 10FT (ELECTRODE) IMPLANT
PIN 4.5MM CANC THREADED AO (PIN) ×3 IMPLANT
PLATE LOCK LG LT CALC FOOT (Plate) ×3 IMPLANT
SCREW CORT T15 28X3.5XST LCK (Screw) ×1 IMPLANT
SCREW CORTICAL 3.5X28MM (Screw) ×2 IMPLANT
SCREW CORTICAL 3.5X46MM (Screw) ×3 IMPLANT
SCREW LOCK CORT STAR 3.5X24 (Screw) ×6 IMPLANT
SCREW LOCK CORT STAR 3.5X30 (Screw) ×6 IMPLANT
SCREW LOCK CORT STAR 3.5X32 (Screw) ×6 IMPLANT
SCREW LOCK CORT STAR 3.5X34 (Screw) ×3 IMPLANT
SCREW LP 3.5 (Screw) ×3 IMPLANT
SCREW LP 3.5X38MM (Screw) ×6 IMPLANT
SPONGE LAP 18X18 X RAY DECT (DISPOSABLE) ×6 IMPLANT
SPONGE SCRUB IODOPHOR (GAUZE/BANDAGES/DRESSINGS) IMPLANT
SUCTION FRAZIER HANDLE 10FR (MISCELLANEOUS) ×2
SUCTION TUBE FRAZIER 10FR DISP (MISCELLANEOUS) ×1 IMPLANT
SUT ETHILON 3 0 PS 1 (SUTURE) ×3 IMPLANT
SUT VIC AB 2-0 CT3 27 (SUTURE) ×3 IMPLANT
SUT VIC AB 2-0 SH 18 (SUTURE) ×3 IMPLANT
SUT VIC AB 3-0 FS2 27 (SUTURE) ×3 IMPLANT
SYR CONTROL 10ML LL (SYRINGE) IMPLANT
TOWEL OR 17X24 6PK STRL BLUE (TOWEL DISPOSABLE) ×3 IMPLANT
TOWEL OR 17X26 10 PK STRL BLUE (TOWEL DISPOSABLE) ×6 IMPLANT
TUBE CONNECTING 12'X1/4 (SUCTIONS) ×1
TUBE CONNECTING 12X1/4 (SUCTIONS) ×2 IMPLANT
UNDERPAD 30X30 (UNDERPADS AND DIAPERS) ×3 IMPLANT
WATER STERILE IRR 1000ML POUR (IV SOLUTION) ×3 IMPLANT

## 2016-04-04 NOTE — Transfer of Care (Signed)
Immediate Anesthesia Transfer of Care Note  Patient: Lawrence Mack  Procedure(s) Performed: Procedure(s): OPEN REDUCTION INTERNAL FIXATION (ORIF) LEFT  CALCANEOUS FRACTURE (Left)  Patient Location: PACU  Anesthesia Type:GA combined with regional for post-op pain  Level of Consciousness: awake, alert , oriented, sedated, patient cooperative and responds to stimulation  Airway & Oxygen Therapy: Patient Spontanous Breathing  Post-op Assessment: Report given to RN, Post -op Vital signs reviewed and stable, Patient moving all extremities and Patient moving all extremities X 4  Post vital signs: Reviewed and stable  Last Vitals:  Vitals:   04/04/16 0652  BP: 102/67  Pulse: 75  Resp: 18  Temp: 37.1 C    Last Pain:  Vitals:   04/04/16 0652  TempSrc: Oral         Complications: No apparent anesthesia complications

## 2016-04-04 NOTE — Anesthesia Procedure Notes (Signed)
Procedure Name: Intubation Date/Time: 04/04/2016 8:34 AM Performed by: Jacquiline Doe A Pre-anesthesia Checklist: Patient identified, Emergency Drugs available, Suction available and Patient being monitored Patient Re-evaluated:Patient Re-evaluated prior to inductionOxygen Delivery Method: Circle System Utilized and Circle system utilized Preoxygenation: Pre-oxygenation with 100% oxygen Intubation Type: IV induction and Cricoid Pressure applied Ventilation: Mask ventilation without difficulty Laryngoscope Size: Mac and 4 Grade View: Grade I Tube type: Oral Tube size: 7.5 mm Number of attempts: 1 Airway Equipment and Method: Stylet and Oral airway Placement Confirmation: ETT inserted through vocal cords under direct vision,  positive ETCO2 and breath sounds checked- equal and bilateral Secured at: 23 cm Tube secured with: Tape Dental Injury: Teeth and Oropharynx as per pre-operative assessment

## 2016-04-04 NOTE — Progress Notes (Signed)
Pt received from PACU. Pt and pt's mother oriented to room and equipment. Pt denies needs at this time. Pt experiencing 4/10 pain - denies pain medication at this time. Call light within reach, orders released, will continue to monitor.   Fritz Pickerel, RN

## 2016-04-04 NOTE — Anesthesia Procedure Notes (Signed)
Anesthesia Regional Block:  Popliteal block  Pre-Anesthetic Checklist: ,, timeout performed, Correct Patient, Correct Site, Correct Laterality, Correct Procedure, Correct Position, site marked, Risks and benefits discussed,  Surgical consent,  Pre-op evaluation,  At surgeon's request and post-op pain management  Laterality: Left and Lower  Prep: Maximum Sterile Barrier Precautions used, chloraprep       Needles:  Injection technique: Single-shot  Needle Type: Echogenic Stimulator Needle     Needle Length: 10cm 10 cm Needle Gauge: 21 G    Additional Needles:  Procedures: ultrasound guided (picture in chart) Popliteal block Narrative:  Injection made incrementally with aspirations every 5 mL.  Performed by: Personally   Additional Notes: Risks, benefits and alternative to block explained extensively.  Patient tolerated procedure well, without complications.

## 2016-04-04 NOTE — Evaluation (Signed)
Physical Therapy Evaluation Patient Details Name: Lawrence ChimesJames Sazama MRN: [id number] DOB: 05/17/1976 Today's Date: 04/04/2016   History of Present Illness   40 y/o male sustained a L calcaneus fracture on 03/18/2016 after falling off ladder. He has been seen at our office for full evaluation. Pt presents for ORIF of his left calcaneus. Pt smokes about 1 PPD. No other significant medical history   Clinical Impression  Pt does well with NWB with crutches. He was doing this a week prior to surgery he says as foot too painful to put down.  Pt will do steps in the morning with PT and then hoping to go home.  No follow up needs.  Pt reports he will have to the one driving as his mom doesn't drive.  Talked about not mixing pain meds and driving.  Encouraged smoking cessation but pt wanting to go outside for cigarette now.    Follow Up Recommendations No PT follow up    Equipment Recommendations  None recommended by PT    Recommendations for Other Services       Precautions / Restrictions Precautions Precautions: Fall Restrictions Weight Bearing Restrictions: Yes LLE Weight Bearing: Non weight bearing (for 8 weeks)      Mobility  Bed Mobility Overal bed mobility: Independent                Transfers Overall transfer level: Modified independent Equipment used: Crutches             General transfer comment: Educated pt on proper use of crutch placement with sit to standing  Ambulation/Gait Ambulation/Gait assistance: Min guard Ambulation Distance (Feet): 30 Feet Assistive device: Crutches       General Gait Details: pt did well with NWB technque.  pt stood at sink to wash his hands nad put left knee - hip ER on sink for support and to keep foot off ground.  pt very flexible  Stairs            Wheelchair Mobility    Modified Rankin (Stroke Patients Only)       Balance Overall balance assessment: No apparent balance deficits (not formally assessed)                                           Pertinent Vitals/Pain Pain Assessment: 0-10 Pain Score: 6  Pain Location: left ankle - after walking to bathroom Pain Intervention(s): Patient requesting pain meds-RN notified;Repositioned    Home Living Family/patient expects to be discharged to:: Private residence Living Arrangements: Parent Available Help at Discharge: Family Type of Home: House Home Access: Stairs to enter   [employer] CorporationEntrance Stairs-Number of Steps: pt has 3 steps to enter - has 2 rails but he has been doing with crutches only Home Layout: One level Home Equipment: Crutches;Grab bars - toilet;Tub bench      Prior Function Level of Independence: Independent         Comments: pt was independent until he fell of the ladder.  he has had to wait until swelling went down to have surgery.  He has been using crutches and not putting weight down for about a week due to the pain.     Hand Dominance        Extremity/Trunk Assessment               Lower Extremity Assessment: Overall WFL for tasks assessed (left  ankle with surgical bandage, ace wrap and drain.  pt could wiggle toes.  ankle NT)      Cervical / Trunk Assessment: Normal  Communication   Communication: No difficulties  Cognition Arousal/Alertness: Awake/alert Behavior During Therapy: WFL for tasks assessed/performed Overall Cognitive Status: Within Functional Limits for tasks assessed                      General Comments      Exercises     Assessment/Plan    PT Assessment Patient needs continued PT services  PT Problem List Decreased activity tolerance;Decreased knowledge of use of DME;Decreased knowledge of precautions          PT Treatment Interventions DME instruction;Gait training;Stair training;Functional mobility training;Patient/family education    PT Goals (Current goals can be found in the Care Plan section)  Acute Rehab PT Goals Patient Stated Goal: to go home  tomorrow.  pt knows he has to be NWB x 8 weeks PT Goal Formulation: With patient/family Time For Goal Achievement: 04/04/16 Potential to Achieve Goals: Good    Frequency Min 6X/week   Barriers to discharge        Co-evaluation               End of Session Equipment Utilized During Treatment: Gait belt Activity Tolerance: Patient tolerated treatment well Patient left: in bed;with family/visitor present;with call bell/phone within reach Nurse Communication: Mobility status         Time: 1600-1630 PT Time Calculation (min) (ACUTE ONLY): 30 min   Charges:   PT Evaluation $PT Eval Low Complexity: 1 Procedure PT Treatments $Gait Training: 8-22 mins   PT G Codes:        Loyal Buba 04/04/2016, 4:42 PM 04/04/2016   Rande Lawman, PT

## 2016-04-04 NOTE — Brief Op Note (Signed)
04/04/2016  10:59 AM  PATIENT:  Lawrence Mack  40 y.o. male  PRE-OPERATIVE DIAGNOSIS:  LEFT CALCANEUS FRACTURE  POST-OPERATIVE DIAGNOSIS:  LEFT CALCANEUS FRACTURE  PROCEDURE:  Procedure(s): OPEN REDUCTION INTERNAL FIXATION (ORIF) LEFT  CALCANEUS FRACTURE (Left)  SURGEON:  Surgeon(s) and Role:    * Altamese Capitola, MD - Primary  ASSISTANTS: none   ANESTHESIA:   general  EBL:  Total I/O In: 1900 [I.V.:1900] Out: 10 [Blood:10]  BLOOD ADMINISTERED:none  DRAINS: TLS drain   LOCAL MEDICATIONS USED:  NONE  SPECIMEN:  No Specimen  DISPOSITION OF SPECIMEN:  N/A  COUNTS:  YES  TOURNIQUET:   Total Tourniquet Time Documented: Thigh (Left) - 99 minutes Total: Thigh (Left) - 99 minutes   DICTATION: .Other Dictation: Dictation Number NO NUMBER GIVEN; OR LOUNGE PHONES NEED TO BE REPLACED  PLAN OF CARE: Admit to inpatient   PATIENT DISPOSITION:  PACU - hemodynamically stable.   Delay start of Pharmacological VTE agent (>24hrs) due to surgical blood loss or risk of bleeding: no

## 2016-04-04 NOTE — Anesthesia Preprocedure Evaluation (Addendum)
Anesthesia Evaluation  Patient identified by MRN, date of birth, ID band Patient awake    Reviewed: Allergy & Precautions, NPO status , Patient's Chart, lab work & pertinent test results  Airway Mallampati: II  TM Distance: >3 FB Neck ROM: Full    Dental no notable dental hx. (+) Poor Dentition, Missing   Pulmonary Current Smoker,    Pulmonary exam normal breath sounds clear to auscultation       Cardiovascular negative cardio ROS Normal cardiovascular exam Rhythm:Regular Rate:Normal     Neuro/Psych negative neurological ROS  negative psych ROS   GI/Hepatic negative GI ROS, Neg liver ROS,   Endo/Other  negative endocrine ROS  Renal/GU negative Renal ROS  negative genitourinary   Musculoskeletal negative musculoskeletal ROS (+)   Abdominal   Peds negative pediatric ROS (+)  Hematology negative hematology ROS (+)   Anesthesia Other Findings   Reproductive/Obstetrics negative OB ROS                            Anesthesia Physical Anesthesia Plan  ASA: II  Anesthesia Plan: General   Post-op Pain Management: GA combined w/ Regional for post-op pain   Induction: Intravenous  Airway Management Planned: LMA and Oral ETT  Additional Equipment:   Intra-op Plan:   Post-operative Plan: Extubation in OR  Informed Consent: I have reviewed the patients History and Physical, chart, labs and discussed the procedure including the risks, benefits and alternatives for the proposed anesthesia with the patient or authorized representative who has indicated his/her understanding and acceptance.   Dental advisory given  Plan Discussed with: CRNA  Anesthesia Plan Comments: (Popliteal block)        Anesthesia Quick Evaluation

## 2016-04-05 ENCOUNTER — Encounter (HOSPITAL_COMMUNITY): Payer: Self-pay | Admitting: Orthopedic Surgery

## 2016-04-05 DIAGNOSIS — F17200 Nicotine dependence, unspecified, uncomplicated: Secondary | ICD-10-CM | POA: Diagnosis present

## 2016-04-05 LAB — CALCITRIOL (1,25 DI-OH VIT D): Vit D, 1,25-Dihydroxy: 22.8 pg/mL (ref 19.9–79.3)

## 2016-04-05 LAB — CBC
HEMATOCRIT: 42.8 % (ref 39.0–52.0)
HEMOGLOBIN: 15 g/dL (ref 13.0–17.0)
MCH: 34.6 pg — AB (ref 26.0–34.0)
MCHC: 35 g/dL (ref 30.0–36.0)
MCV: 98.8 fL (ref 78.0–100.0)
Platelets: 166 10*3/uL (ref 150–400)
RBC: 4.33 MIL/uL (ref 4.22–5.81)
RDW: 11.3 % — ABNORMAL LOW (ref 11.5–15.5)
WBC: 15.2 10*3/uL — ABNORMAL HIGH (ref 4.0–10.5)

## 2016-04-05 LAB — VITAMIN D 25 HYDROXY (VIT D DEFICIENCY, FRACTURES): VIT D 25 HYDROXY: 36.1 ng/mL (ref 30.0–100.0)

## 2016-04-05 LAB — BASIC METABOLIC PANEL
Anion gap: 8 (ref 5–15)
BUN: <5 mg/dL — ABNORMAL LOW (ref 6–20)
CHLORIDE: 102 mmol/L (ref 101–111)
CO2: 27 mmol/L (ref 22–32)
Calcium: 8.4 mg/dL — ABNORMAL LOW (ref 8.9–10.3)
Creatinine, Ser: 0.61 mg/dL (ref 0.61–1.24)
GFR calc non Af Amer: >60 mL/min (ref >60)
Glucose, Bld: 114 mg/dL — ABNORMAL HIGH (ref 65–99)
POTASSIUM: 3.4 mmol/L — AB (ref 3.5–5.1)
SODIUM: 137 mmol/L (ref 135–145)

## 2016-04-05 MED ORDER — OXYCODONE HCL 5 MG PO TABS: 5.0000 mg | ORAL_TABLET | Freq: Four times a day (QID) | ORAL | 0 refills | Status: AC | PRN

## 2016-04-05 MED ORDER — METHOCARBAMOL 500 MG PO TABS: 500.0000 mg | ORAL_TABLET | Freq: Four times a day (QID) | ORAL | 0 refills | Status: AC | PRN

## 2016-04-05 MED ORDER — ASPIRIN EC 325 MG PO TBEC: 325.0000 mg | DELAYED_RELEASE_TABLET | Freq: Every day | ORAL | 0 refills | Status: AC

## 2016-04-05 MED ORDER — OXYCODONE-ACETAMINOPHEN 5-325 MG PO TABS: 1.0000 | ORAL_TABLET | Freq: Four times a day (QID) | ORAL | 0 refills | Status: AC | PRN

## 2016-04-05 MED ORDER — DOCUSATE SODIUM 100 MG PO CAPS: 100.0000 mg | ORAL_CAPSULE | Freq: Two times a day (BID) | ORAL | 0 refills | Status: AC

## 2016-04-05 NOTE — Progress Notes (Signed)
Physical Therapy Treatment Patient Details Name: Lawrence Mack MRN: [id number] DOB: [birth year] Today's Date: 04-13-16    History of Present Illness  40 y/o male sustained a L calcaneus fracture on 03/18/2016 after falling off ladder. He has been seen at our office for full evaluation. Pt presents for ORIF of his left calcaneus. Pt smokes about 1 PPD. No other significant medical history     PT Comments    Pt mobilizing at Mod I level with crutches and eager for dc home.  Follow Up Recommendations  No PT follow up     Equipment Recommendations  None recommended by PT    Recommendations for Other Services       Precautions / Restrictions Precautions Precautions: Fall Restrictions Weight Bearing Restrictions: Yes LLE Weight Bearing: Non weight bearing    Mobility  Bed Mobility Overal bed mobility: Independent                Transfers Overall transfer level: Modified independent Equipment used: Crutches             General transfer comment: Educated pt on proper use of crutch placement with sit to standing  Ambulation/Gait Ambulation/Gait assistance: Modified independent (Device/Increase time) Ambulation Distance (Feet): 300 Feet Assistive device: Crutches       General Gait Details: Pt with good balance/stability   Stairs Stairs: Yes Stairs assistance: Supervision Stair Management: No rails;One rail Right;Forwards;With crutches Number of Stairs: 4 General stair comments: 2 steps with rail and crutch and 2 steps with Bil crutches  Wheelchair Mobility    Modified Rankin (Stroke Patients Only)       Balance Overall balance assessment: No apparent balance deficits (not formally assessed)                                  Cognition Arousal/Alertness: Awake/alert Behavior During Therapy: WFL for tasks assessed/performed Overall Cognitive Status: Within Functional Limits for tasks assessed                       Exercises      General Comments        Pertinent Vitals/Pain Pain Assessment: 0-10 Pain Score: 5  Pain Location: L ankle Pain Descriptors / Indicators: Aching Pain Intervention(s): Limited activity within patient's tolerance;Monitored during session;Premedicated before session;Ice applied    Home Living                      Prior Function            PT Goals (current goals can now be found in the care plan section) Acute Rehab PT Goals Patient Stated Goal: HOME PT Goal Formulation: With patient/family Time For Goal Achievement: 04/04/16 Potential to Achieve Goals: Good Progress towards PT goals: Progressing toward goals    Frequency    Min 6X/week      PT Plan Current plan remains appropriate    Co-evaluation             End of Session Equipment Utilized During Treatment: Gait belt Activity Tolerance: Patient tolerated treatment well Patient left: in bed;with call bell/phone within reach     Time: 0900-0913 PT Time Calculation (min) (ACUTE ONLY): 13 min  Charges:  $Gait Training: 8-22 mins                    G Codes:      Lawrence Mack 13-Apr-2016, 9:14  AM

## 2016-04-05 NOTE — Progress Notes (Signed)
[facility] Progress Note  Subjective  Doing fantastic Wants to go home No issues Has mobilized with and without therapy  Pain well controlled Tolerating reg diet   Review of Systems  Constitutional: Negative for chills and fever.  Eyes: Negative for blurred vision and double vision.  Respiratory: Negative for shortness of breath and wheezing.   Cardiovascular: Negative for chest pain and palpitations.  Gastrointestinal: Negative for abdominal pain, nausea and vomiting.  Genitourinary: Negative for dysuria.  Neurological: Negative for headaches.     Objective   BP 106/72 (BP Location: Left Arm)   Pulse 75   Temp 98.1 F (36.7 C) (Oral)   Resp 16   Ht '5\' 9"'  (1.753 m)   Wt 57.6 kg (127 lb)   SpO2 99%   BMI 18.75 kg/m   Intake/Output      10/10 0701 - 10/11 0700 10/11 0701 - 10/12 0700   P.O. 240    I.V. (mL/kg) 2750 (47.7)    Total Intake(mL/kg) 2990 (51.9)    Urine (mL/kg/hr) 0 (0)    Drains 10 (0)    Stool 0 (0)    Blood 10 (0)    Total Output 20     Net +2970          Urine Occurrence 5 x    Stool Occurrence 1 x      Labs  Results for CANTRELL, LAROUCHE (MRN [id number]) as of 04/05/2016 09:22  Ref. Range 04/05/2016 00:24  Sodium Latest Ref Range: 135 - 145 mmol/L 137  Potassium Latest Ref Range: 3.5 - 5.1 mmol/L 3.4 (L)  Chloride Latest Ref Range: 101 - 111 mmol/L 102  CO2 Latest Ref Range: 22 - 32 mmol/L 27  BUN Latest Ref Range: 6 - 20 mg/dL <5 (L)  Creatinine Latest Ref Range: 0.61 - 1.24 mg/dL 0.61  Calcium Latest Ref Range: 8.9 - 10.3 mg/dL 8.4 (L)  EGFR (Non-African Amer.) Latest Ref Range: >60 mL/min >60  EGFR (African American) Latest Ref Range: >60 mL/min >60  Glucose Latest Ref Range: 65 - 99 mg/dL 114 (H)  Anion gap Latest Ref Range: 5 - 15  8  WBC Latest Ref Range: 4.0 - 10.5 K/uL 15.2 (H)  RBC Latest Ref Range: 4.22 - 5.81 MIL/uL 4.33  Hemoglobin Latest Ref Range: 13.0 - 17.0 g/dL 15.0  HCT Latest Ref Range: 39.0 - 52.0  % 42.8  MCV Latest Ref Range: 78.0 - 100.0 fL 98.8  MCH Latest Ref Range: 26.0 - 34.0 pg 34.6 (H)  MCHC Latest Ref Range: 30.0 - 36.0 g/dL 35.0  RDW Latest Ref Range: 11.5 - 15.5 % 11.3 (L)  Platelets Latest Ref Range: 150 - 400 K/uL 166  Results for CAMERYN, CHRISLEY (MRN [id number]) as of 04/05/2016 09:22  Ref. Range 04/04/2016 07:32  Vitamin D, 25-Hydroxy Latest Ref Range: 30.0 - 100.0 ng/mL 36.1    Exam  Gen: resting comfortably in bed, NAD Ext:       Left Lower Extremity   Splint c/d/i  Drain removed at bedside this am  Distal motor and sensory functions intact  EHL, FHL, Lesser toe motor functions intact  Ext warm  + DP pulse   Swelling controlled    Assessment and Plan   POD/HD#: 1  40 y/o male s/p fall off ladder with L calcaneus fracture  -comminuted intra-articular L calcaneus fracture s/p ORIF  NWB x 8 weeks  Splint x 2 weeks then unrestricted ROM   Ice and elevate for swelling control  Does not need home therapy   Drain dc'd at bedside   - Pain management:  Percocet   Oxy IR   Robaxin  - ABL anemia/Hemodynamics  Stable  - Medical issues   Nicotine dependence   Discussed risks of continued nicotine use as it relates to bone and wound healing. Continued use increases his risk of nonunion and deep infection   - DVT/PE prophylaxis:  ASA 325 mg po daily x 4 weeks - ID:   perop abx completed   - Activity:  NWB L leg o/w activity as tolerated   - FEN/GI prophylaxis/Foley/Lines:  Reg diet  Dc IV  -Ex-fix/Splint care:  Discussed splint care with pt  Will remove at first follow up   - Impediments to fracture healing:  Nicotine use  - Dispo:  DC home today   Follow up in 2 weeks     Jari Pigg, PA-C [facility] [phone] (P) [phone] (O) 04/05/2016 9:21 AM

## 2016-04-05 NOTE — Discharge Instructions (Signed)
Orthopaedic Trauma Service Discharge Instructions   General Discharge Instructions  WEIGHT BEARING STATUS: Nonweightbearing left lower extremity   RANGE OF MOTION/ACTIVITY: no range of motion of left ankle as you are in a splint. We will remove splint at first follow.   Wound Care: no formal wound care. Keep splint clean and dry   PAIN MEDICATION USE AND EXPECTATIONS  You have likely been given narcotic medications to help control your pain.  After a traumatic event that results in an fracture (broken bone) with or without surgery, it is ok to use narcotic pain medications to help control one's pain.  We understand that everyone responds to pain differently and each individual patient will be evaluated on a regular basis for the continued need for narcotic medications. Ideally, narcotic medication use should last no more than 6-8 weeks (coinciding with fracture healing).   As a patient it is your responsibility as well to monitor narcotic medication use and report the amount and frequency you use these medications when you come to your office visit.   We would also advise that if you are using narcotic medications, you should take a dose prior to therapy to maximize you participation.  IF YOU ARE ON NARCOTIC MEDICATIONS IT IS NOT PERMISSIBLE TO OPERATE A MOTOR VEHICLE (MOTORCYCLE/CAR/TRUCK/MOPED) OR HEAVY MACHINERY DO NOT MIX NARCOTICS WITH OTHER CNS (CENTRAL NERVOUS SYSTEM) DEPRESSANTS SUCH AS ALCOHOL  Diet: as you were eating previously.  Can use over the counter stool softeners and bowel preparations, such as Miralax, to help with bowel movements.  Narcotics can be constipating.  Be sure to drink plenty of fluids    STOP SMOKING OR USING NICOTINE PRODUCTS!!!!  As discussed nicotine severely impairs your body's ability to heal surgical and traumatic wounds but also impairs bone healing.  Wounds and bone heal by forming microscopic blood vessels (angiogenesis) and nicotine is a  vasoconstrictor (essentially, shrinks blood vessels).  Therefore, if vasoconstriction occurs to these microscopic blood vessels they essentially disappear and are unable to deliver necessary nutrients to the healing tissue.  This is one modifiable factor that you can do to dramatically increase your chances of healing your injury.    (This means no smoking, no nicotine gum, patches, etc)  DO NOT USE NONSTEROIDAL ANTI-INFLAMMATORY DRUGS (NSAID'S)  Using products such as Advil (ibuprofen), Aleve (naproxen), Motrin (ibuprofen) for additional pain control during fracture healing can delay and/or prevent the healing response.  If you would like to take over the counter (OTC) medication, Tylenol (acetaminophen) is ok.  However, some narcotic medications that are given for pain control contain acetaminophen as well. Therefore, you should not exceed more than 4000 mg of tylenol in a day if you do not have liver disease.  Also note that there are may OTC medicines, such as cold medicines and allergy medicines that my contain tylenol as well.  If you have any questions about medications and/or interactions please ask your doctor/PA or your pharmacist.      ICE AND ELEVATE INJURED/OPERATIVE EXTREMITY  Using ice and elevating the injured extremity above your heart can help with swelling and pain control.  Icing in a pulsatile fashion, such as 20 minutes on and 20 minutes off, can be followed.    Do not place ice directly on skin. Make sure there is a barrier between to skin and the ice pack.    Using frozen items such as frozen peas works well as the conform nicely to the are that needs to be iced.  USE AN ACE WRAP OR TED HOSE FOR SWELLING CONTROL  In addition to icing and elevation, Ace wraps or TED hose are used to help limit and resolve swelling.  It is recommended to use Ace wraps or TED hose until you are informed to stop.    When using Ace Wraps start the wrapping distally (farthest away from the body) and  wrap proximally (closer to the body)   Example: If you had surgery on your leg or thing and you do not have a splint on, start the ace wrap at the toes and work your way up to the thigh        If you had surgery on your upper extremity and do not have a splint on, start the ace wrap at your fingers and work your way up to the upper arm  IF YOU ARE IN A SPLINT OR CAST DO NOT Flora   If your splint gets wet for any reason please contact the office immediately. You may shower in your splint or cast as long as you keep it dry.  This can be done by wrapping in a cast cover or garbage back (or similar)  Do Not stick any thing down your splint or cast such as pencils, money, or hangers to try and scratch yourself with.  If you feel itchy take benadryl as prescribed on the bottle for itching  IF YOU ARE IN A CAM BOOT (BLACK BOOT)  You may remove boot periodically. Perform daily dressing changes as noted below.  Wash the liner of the boot regularly and wear a sock when wearing the boot. It is recommended that you sleep in the boot until told otherwise  CALL THE OFFICE WITH ANY QUESTIONS OR CONCERNS: [phone]

## 2016-04-05 NOTE — Progress Notes (Signed)
Reviewed discharge information/medications with patient. Answered all of his questions.  Patient is waiting for ride.

## 2016-04-05 NOTE — Evaluation (Signed)
Occupational Therapy Evaluation/Discharge Patient Details Name: Lawrence Mack MRN: [id number] DOB: 05/13/1976 Today's Date: 04/05/2016    History of Present Illness  40 y/o male sustained a L calcaneus fracture on 03/18/2016 after falling off ladder. He has been seen at our office for full evaluation. Pt presents for ORIF of his left calcaneus. Pt smokes about 1 PPD. No other significant medical history    Clinical Impression   PTA, pt was independent with assistive devices with all ADL. He was using crutches following injury for functional mobility. Pt currently requires supervision with functional mobility and ADL. Pt plans to D/C home with mother who can provide intermittent supervision. Completed education concerning safe performance of ADL while adhering to NWB LLE precautions, fall prevention, and shower transfer. Pt verbalizes and demonstrates understanding. Pt has no further OT needs. OT signing off.    Follow Up Recommendations  No OT follow up;Supervision - Intermittent    Equipment Recommendations  None recommended by OT       Precautions / Restrictions Precautions Precautions: Fall Restrictions Weight Bearing Restrictions: Yes LLE Weight Bearing: Non weight bearing      Mobility Bed Mobility Overal bed mobility: Independent                Transfers Overall transfer level: Modified independent Equipment used: Crutches             General transfer comment: Educated pt on proper use of crutch placement with sit to standing    Balance Overall balance assessment: No apparent balance deficits (not formally assessed)                                          ADL Overall ADL's : Needs assistance/impaired     Grooming: Supervision/safety;Standing   Upper Body Bathing: Modified independent;Sitting   Lower Body Bathing: Supervison/ safety;Sit to/from stand   Upper Body Dressing : Modified independent;Sitting   Lower Body Dressing:  Supervision/safety;Sit to/from stand   Toilet Transfer: Supervision/safety;Ambulation;Regular Toilet   Toileting- Water quality scientist and Hygiene: Supervision/safety;Sit to/from stand   Tub/ Shower Transfer: Supervision/safety;Ambulation;Tub bench   Functional mobility during ADLs: Supervision/safety ([place]) General ADL Comments: Educated pt on fall prevention, dressing techniques, and ADL performance with NWB status.               Pertinent Vitals/Pain Pain Assessment: 0-10 Pain Score: 7  Pain Location: L ankle  Pain Descriptors / Indicators: Aching;Sore;Stabbing Pain Intervention(s): Monitored during session;Limited activity within patient's tolerance;Repositioned     Hand Dominance Right   Extremity/Trunk Assessment Upper Extremity Assessment Upper Extremity Assessment: Overall WFL for tasks assessed   Lower Extremity Assessment Lower Extremity Assessment: LLE deficits/detail;Overall WFL for tasks assessed LLE Deficits / Details: NWB LLE at this time.       Communication Communication Communication: No difficulties     Home Living Family/patient expects to be discharged to:: Private residence Living Arrangements: Parent Available Help at Discharge: Family Type of Home: House Home Access: Stairs to enter [employer] of Steps: pt has 3 steps to enter - has 2 rails but he has been doing with crutches only Entrance Stairs-Rails: Can reach both (But using crutches only) Home Layout: One level     Bathroom Shower/Tub: Tub/shower unit Shower/tub characteristics: Door Biochemist, clinical: Standard Bathroom Accessibility: Yes   Home Equipment: Crutches;Grab bars - toilet;Tub bench   Additional Comments: Has grab bar railing throughout home.  Prior Functioning/Environment Level of Independence: Independent        Comments: pt was independent until he fell of the ladder.  he has had to wait until swelling went down to have surgery.  He has  been using crutches and not putting weight down for about a week due to the pain.  -      OT Problem List: Decreased strength;Decreased range of motion;Decreased activity tolerance;Decreased safety awareness;Pain   OT Treatment/Interventions:      OT Goals(Current goals can be found in the care plan section) Acute Rehab OT Goals Patient Stated Goal: to go home OT Goal Formulation: With patient   End of Session Equipment Utilized During Treatment:  (Crutches) Nurse Communication:  (IV stop temporarily for dressing.)  Activity Tolerance: Patient tolerated treatment well Patient left: in bed;with call bell/phone within reach;with nursing/sitter in room   Time: 1043-1103 OT Time Calculation (min): 20 min Charges:  OT General Charges $OT Visit: 1 Procedure OT Evaluation $OT Eval Moderate Complexity: 1 Procedure  Norman Herrlich, OTR/L [phone] 04/05/2016, 12:36 PM

## 2016-04-05 NOTE — Op Note (Signed)
NAME:  Lawrence Mack NO.:  [id number]  MEDICAL RECORD NO.:  [id number]  LOCATION:  5N09C                        FACILITY:  [facility]  PHYSICIAN:  Astrid Divine. Marcelino Scot, M.D. DATE OF BIRTH:  1976/03/04  DATE OF PROCEDURE:  04/04/2016 DATE OF DISCHARGE:                              OPERATIVE REPORT   PREOPERATIVE DIAGNOSIS:  Left calcaneus fracture.  POSTOPERATIVE DIAGNOSIS:  Left calcaneus fracture.  PROCEDURE:  Open reduction and internal fixation of left calcaneus.  SURGEON:  Astrid Divine. Marcelino Scot, M.D.  ASSISTANT:  None.  ANESTHESIA:  General.  COMPLICATIONS:  None.  TOTAL TOURNIQUET TIME:  99 minutes.  DISPOSITION:  To PACU.  CONDITION:  Stable.  BRIEF SUMMARY OF INDICATION FOR PROCEDURE:  Lawrence Mack is a 40 year old male, who fell off a ladder producing severely displaced joint depression fracture.  He has undergone a period of observation to allow for soft tissue swelling resolution.  He now presents for definitive internal fixation.  We discussed with him the risks and benefits of surgery including the potential for arthritis, nerve injury, vessel injury, DVT, PE, heart attack, stroke, subtalar arthritis that could result, and the need for fusion, wound breakdown or infection which could result in eventual amputation, and others.  After full discussion, the patient did wish to proceed.  BRIEF SUMMARY OF PROCEDURE:  The patient was taken to the operating room, where general anesthesia was induced.  His left lower extremity was prepped and draped in the usual sterile fashion, and a nonsterile tourniquet was placed about the thigh.  Leg was elevated after a sterile prep and drape and exsanguinated with an Esmarch bandage.  Tourniquet was inflated to 300 mmHg.  A standard extensile approach was made, and a full-thickness flap elevated from the periosteum.  The peroneal tendons were reflected superiorly.  The sural nerve was identified in the  distal edge of the incision and protected throughout.  The lateral wall was then taken down once identifying the fracture line, and the joint depression piece laterally mobilized and elevated.  The tuberosity was then mobilized from the sustentacular fragment, where there had also been shortening of the hindfoot and a Schanz pin was placed into this and used to restore the length, height, and valgus orientation.  This was followed by provisional pin fixation and then screw compression using standard screws to the Biomet-locked plate.  Fixation was secured in the anterior process of the tuberosity and the sustentacular with restoration of articular incongruity, calcaneal height, and reduction of the lateral wall blowout with appropriate hindfoot alignment.  This was confirmed on final images.  Wound irrigated thoroughly, closed in a standard layered fashion after placement of a TLS drain using 2-0 Vicryl and 3-0 nylon.  Sterile gently compressive dressing was applied and a posterior stirrup splint.  The patient was awakened from the anesthesia and transported to the PACU in stable condition.  He was somewhat combative during the wake up from anesthesia, but was protected throughout and IV maintained.  PROGNOSIS:  Mr. Nazareno will be nonweightbearing with strict avoidance of nicotine products, following up in 10 days after discharge for conversion into a CAM boot and wound check.  We would expect resumption of  weightbearing at 3 weeks.  He will be on formal DVT prophylaxis.     Astrid Divine. Marcelino Scot, M.D.     MHH/MEDQ  D:  04/04/2016  T:  04/05/2016  Job:  [id number]

## 2016-04-05 NOTE — Anesthesia Postprocedure Evaluation (Signed)
Anesthesia Post Note  Patient: Lawrence Mack  Procedure(s) Performed: Procedure(s) (LRB): OPEN REDUCTION INTERNAL FIXATION (ORIF) LEFT  CALCANEOUS FRACTURE (Left)  Patient location during evaluation: PACU Anesthesia Type: General and Regional Level of consciousness: awake and alert Pain management: pain level controlled Vital Signs Assessment: post-procedure vital signs reviewed and stable Respiratory status: spontaneous breathing, nonlabored ventilation, respiratory function stable and patient connected to nasal cannula oxygen Cardiovascular status: blood pressure returned to baseline and stable Postop Assessment: no signs of nausea or vomiting Anesthetic complications: no     Last Vitals:  Vitals:   04/05/16 0500 04/05/16 1249  BP: 106/72 110/77  Pulse: 75 (!) 59  Resp: 16 16  Temp: 36.7 C 36.7 C    Last Pain:  Vitals:   04/05/16 1249  TempSrc: Oral  PainSc:    Pain Goal: Patients Stated Pain Goal: 3 (04/05/16 0420)               Montez Hageman

## 2016-04-05 NOTE — Discharge Summary (Signed)
Orthopaedic Trauma Service (OTS)  Patient ID: Lawrence Mack MRN: [id number] DOB/AGE: [birth year] 40 y.o.  Admit date: 04/04/2016 Discharge date: 04/05/2016  Admission Diagnoses: Displaced intra-articular Left calcaneus fracture Nicotine dependence   Discharge Diagnoses:  Principal Problem:   Displaced intraarticular fracture of left calcaneus, initial encounter for closed fracture Active Problems:   Nicotine dependence   Procedures Performed: 04/04/2016- Dr. Marcelino Scot  Open reduction and internal fixation of left calcaneus   Discharged Condition: good  Hospital Course:   40 year old white male admitted to the hospital yesterday 04/04/2016 for ORIF of his left calcaneus. Patient sustained a closed calcaneus fracture about 2-1/2 weeks ago. We awaited adequate resolution of his soft tissue swelling to allow for safe surgical intervention. Patient underwent the procedure noted above on 04/04/2016. Preoperatively he did have a nerve block. After surgery patient was transferred to the PACU for recovery from anesthesia and transferred to the orthopedic for further observation, pain control and physical therapy. Patient did remarkably well overnight and into postop day #1. His pain was well-controlled with oral pain medication after his block wore off. He was mobilizing well under his own power and along with physical therapy utilizing crutches. Patient did not have any additional issues or concerns. He was tolerating a regular diet on postoperative day #1 voiding without difficulty on postoperative day #1 and passing flatus. He was deemed to be stable for discharge on postoperative day #1.  Consults: None  Significant Diagnostic Studies: labs:  Results for COLIN, ELLERS (MRN [id number]) as of 04/05/2016 09:22  Ref. Range 04/05/2016 00:24  Sodium Latest Ref Range: 135 - 145 mmol/L 137  Potassium Latest Ref Range: 3.5 - 5.1 mmol/L 3.4 (L)  Chloride Latest Ref Range: 101 - 111 mmol/L 102   CO2 Latest Ref Range: 22 - 32 mmol/L 27  BUN Latest Ref Range: 6 - 20 mg/dL <5 (L)  Creatinine Latest Ref Range: 0.61 - 1.24 mg/dL 0.61  Calcium Latest Ref Range: 8.9 - 10.3 mg/dL 8.4 (L)  EGFR (Non-African Amer.) Latest Ref Range: >60 mL/min >60  EGFR (African American) Latest Ref Range: >60 mL/min >60  Glucose Latest Ref Range: 65 - 99 mg/dL 114 (H)  Anion gap Latest Ref Range: 5 - 15  8  WBC Latest Ref Range: 4.0 - 10.5 K/uL 15.2 (H)  RBC Latest Ref Range: 4.22 - 5.81 MIL/uL 4.33  Hemoglobin Latest Ref Range: 13.0 - 17.0 g/dL 15.0  HCT Latest Ref Range: 39.0 - 52.0 % 42.8  MCV Latest Ref Range: 78.0 - 100.0 fL 98.8  MCH Latest Ref Range: 26.0 - 34.0 pg 34.6 (H)  MCHC Latest Ref Range: 30.0 - 36.0 g/dL 35.0  RDW Latest Ref Range: 11.5 - 15.5 % 11.3 (L)  Platelets Latest Ref Range: 150 - 400 K/uL 166  Results for RUMI, TARAS (MRN [id number]) as of 04/05/2016 09:22  Ref. Range 04/03/2016 13:17 04/04/2016 07:32  Phosphorus Latest Ref Range: 2.5 - 4.6 mg/dL  4.1  Magnesium Latest Ref Range: 1.7 - 2.4 mg/dL  1.7  Alkaline Phosphatase Latest Ref Range: 38 - 126 U/L 72   Albumin Latest Ref Range: 3.5 - 5.0 g/dL 3.9   AST Latest Ref Range: 15 - 41 U/L 28   ALT Latest Ref Range: 17 - 63 U/L 14 (L)   Total Protein Latest Ref Range: 6.5 - 8.1 g/dL 6.6   Total Bilirubin Latest Ref Range: 0.3 - 1.2 mg/dL 0.7   Vitamin D, 25-Hydroxy Latest Ref Range: 30.0 - 100.0 ng/mL  36.1  Treatments: IV hydration, antibiotics: Ancef, analgesia: percocet, oxy IR, dilaudid, anticoagulation: LMW heparin and ASA at discharge, therapies: PT and RN and surgery: as above   Discharge Exam:      Orthopaedic Trauma Service Progress Note   Subjective   Doing fantastic Wants to go home No issues Has mobilized with and without therapy   Pain well controlled Tolerating reg diet    Review of Systems  Constitutional: Negative for chills and fever.  Eyes: Negative for blurred vision and double vision.   Respiratory: Negative for shortness of breath and wheezing.   Cardiovascular: Negative for chest pain and palpitations.  Gastrointestinal: Negative for abdominal pain, nausea and vomiting.  Genitourinary: Negative for dysuria.  Neurological: Negative for headaches.        Objective    BP 106/72 (BP Location: Left Arm)   Pulse 75   Temp 98.1 F (36.7 C) (Oral)   Resp 16   Ht '5\' 9"'  (1.753 m)   Wt 57.6 kg (127 lb)   SpO2 99%   BMI 18.75 kg/m    Intake/Output      10/10 0701 - 10/11 0700 10/11 0701 - 10/12 0700   P.O. 240    I.V. (mL/kg) 2750 (47.7)    Total Intake(mL/kg) 2990 (51.9)    Urine (mL/kg/hr) 0 (0)    Drains 10 (0)    Stool 0 (0)    Blood 10 (0)    Total Output 20     Net +2970          Urine Occurrence 5 x    Stool Occurrence 1 x       Labs   Results for QUINTERIOUS, WALRAVEN (MRN [id number]) as of 04/05/2016 09:22   Ref. Range 04/05/2016 00:24  Sodium Latest Ref Range: 135 - 145 mmol/L 137  Potassium Latest Ref Range: 3.5 - 5.1 mmol/L 3.4 (L)  Chloride Latest Ref Range: 101 - 111 mmol/L 102  CO2 Latest Ref Range: 22 - 32 mmol/L 27  BUN Latest Ref Range: 6 - 20 mg/dL <5 (L)  Creatinine Latest Ref Range: 0.61 - 1.24 mg/dL 0.61  Calcium Latest Ref Range: 8.9 - 10.3 mg/dL 8.4 (L)  EGFR (Non-African Amer.) Latest Ref Range: >60 mL/min >60  EGFR (African American) Latest Ref Range: >60 mL/min >60  Glucose Latest Ref Range: 65 - 99 mg/dL 114 (H)  Anion gap Latest Ref Range: 5 - 15  8  WBC Latest Ref Range: 4.0 - 10.5 K/uL 15.2 (H)  RBC Latest Ref Range: 4.22 - 5.81 MIL/uL 4.33  Hemoglobin Latest Ref Range: 13.0 - 17.0 g/dL 15.0  HCT Latest Ref Range: 39.0 - 52.0 % 42.8  MCV Latest Ref Range: 78.0 - 100.0 fL 98.8  MCH Latest Ref Range: 26.0 - 34.0 pg 34.6 (H)  MCHC Latest Ref Range: 30.0 - 36.0 g/dL 35.0  RDW Latest Ref Range: 11.5 - 15.5 % 11.3 (L)  Platelets Latest Ref Range: 150 - 400 K/uL 166  Results for VARIAN, INNES (MRN [id number]) as of 04/05/2016  09:22   Ref. Range 04/04/2016 07:32  Vitamin D, 25-Hydroxy Latest Ref Range: 30.0 - 100.0 ng/mL 36.1      Exam   Gen: resting comfortably in bed, NAD Ext:       Left Lower Extremity              Splint c/d/i             Drain removed at bedside this am  Distal motor and sensory functions intact             EHL, FHL, Lesser toe motor functions intact             Ext warm             + DP pulse              Swelling controlled      Assessment and Plan    POD/HD#: 1   40 y/o male s/p fall off ladder with L calcaneus fracture   -comminuted intra-articular L calcaneus fracture s/p ORIF             NWB x 8 weeks             Splint x 2 weeks then unrestricted ROM                     Ice and elevate for swelling control             Does not need home therapy              Drain dc'd at bedside    - Pain management:             Percocet              Oxy IR              Robaxin   - ABL anemia/Hemodynamics             Stable   - Medical issues              Nicotine dependence                         Discussed risks of continued nicotine use as it relates to bone and wound healing. Continued use increases his risk of nonunion and deep infection    - DVT/PE prophylaxis:             ASA 325 mg po daily x 4 weeks - ID:              perop abx completed    - Activity:             NWB L leg o/w activity as tolerated              - FEN/GI prophylaxis/Foley/Lines:             Reg diet             Dc IV   -Ex-fix/Splint care:             Discussed splint care with pt             Will remove at first follow up    - Impediments to fracture healing:             Nicotine use   - Dispo:             DC home today              Follow up in 2 weeks      Disposition: 01-Home or Self Care  Discharge Instructions    Call MD / Call 911    Complete by:  As directed    If you experience chest pain or shortness of breath, CALL 911 and be transported to the hospital  emergency room.  If you develope a  fever above 101 F, pus (white drainage) or increased drainage or redness at the wound, or calf pain, call your surgeon's office.   Constipation Prevention    Complete by:  As directed    Drink plenty of fluids.  Prune juice may be helpful.  You may use a stool softener, such as Colace (over the counter) 100 mg twice a day.  Use MiraLax (over the counter) for constipation as needed.   Diet general    Complete by:  As directed    Discharge instructions    Complete by:  As directed    Orthopaedic Trauma Service Discharge Instructions   General Discharge Instructions  WEIGHT BEARING STATUS: Nonweightbearing left lower extremity   RANGE OF MOTION/ACTIVITY: no range of motion of left ankle as you are in a splint. We will remove splint at first follow.   Wound Care: no formal wound care. Keep splint clean and dry   PAIN MEDICATION USE AND EXPECTATIONS  You have likely been given narcotic medications to help control your pain.  After a traumatic event that results in an fracture (broken bone) with or without surgery, it is ok to use narcotic pain medications to help control one's pain.  We understand that everyone responds to pain differently and each individual patient will be evaluated on a regular basis for the continued need for narcotic medications. Ideally, narcotic medication use should last no more than 6-8 weeks (coinciding with fracture healing).   As a patient it is your responsibility as well to monitor narcotic medication use and report the amount and frequency you use these medications when you come to your office visit.   We would also advise that if you are using narcotic medications, you should take a dose prior to therapy to maximize you participation.  IF YOU ARE ON NARCOTIC MEDICATIONS IT IS NOT PERMISSIBLE TO OPERATE A MOTOR VEHICLE (MOTORCYCLE/CAR/TRUCK/MOPED) OR HEAVY MACHINERY DO NOT MIX NARCOTICS WITH OTHER CNS (CENTRAL NERVOUS SYSTEM)  DEPRESSANTS SUCH AS ALCOHOL  Diet: as you were eating previously.  Can use over the counter stool softeners and bowel preparations, such as Miralax, to help with bowel movements.  Narcotics can be constipating.  Be sure to drink plenty of fluids    STOP SMOKING OR USING NICOTINE PRODUCTS!!!!  As discussed nicotine severely impairs your body's ability to heal surgical and traumatic wounds but also impairs bone healing.  Wounds and bone heal by forming microscopic blood vessels (angiogenesis) and nicotine is a vasoconstrictor (essentially, shrinks blood vessels).  Therefore, if vasoconstriction occurs to these microscopic blood vessels they essentially disappear and are unable to deliver necessary nutrients to the healing tissue.  This is one modifiable factor that you can do to dramatically increase your chances of healing your injury.    (This means no smoking, no nicotine gum, patches, etc)  DO NOT USE NONSTEROIDAL ANTI-INFLAMMATORY DRUGS (NSAID'S)  Using products such as Advil (ibuprofen), Aleve (naproxen), Motrin (ibuprofen) for additional pain control during fracture healing can delay and/or prevent the healing response.  If you would like to take over the counter (OTC) medication, Tylenol (acetaminophen) is ok.  However, some narcotic medications that are given for pain control contain acetaminophen as well. Therefore, you should not exceed more than 4000 mg of tylenol in a day if you do not have liver disease.  Also note that there are may OTC medicines, such as cold medicines and allergy medicines that my contain tylenol as well.  If you have any questions about  medications and/or interactions please ask your doctor/PA or your pharmacist.      ICE AND ELEVATE INJURED/OPERATIVE EXTREMITY  Using ice and elevating the injured extremity above your heart can help with swelling and pain control.  Icing in a pulsatile fashion, such as 20 minutes on and 20 minutes off, can be followed.    Do not place  ice directly on skin. Make sure there is a barrier between to skin and the ice pack.    Using frozen items such as frozen peas works well as the conform nicely to the are that needs to be iced.  USE AN ACE WRAP OR TED HOSE FOR SWELLING CONTROL  In addition to icing and elevation, Ace wraps or TED hose are used to help limit and resolve swelling.  It is recommended to use Ace wraps or TED hose until you are informed to stop.    When using Ace Wraps start the wrapping distally (farthest away from the body) and wrap proximally (closer to the body)   Example: If you had surgery on your leg or thing and you do not have a splint on, start the ace wrap at the toes and work your way up to the thigh        If you had surgery on your upper extremity and do not have a splint on, start the ace wrap at your fingers and work your way up to the upper arm  IF YOU ARE IN A SPLINT OR CAST DO NOT [place]   If your splint gets wet for any reason please contact the office immediately. You may shower in your splint or cast as long as you keep it dry.  This can be done by wrapping in a cast cover or garbage back (or similar)  Do Not stick any thing down your splint or cast such as pencils, money, or hangers to try and scratch yourself with.  If you feel itchy take benadryl as prescribed on the bottle for itching  IF YOU ARE IN A CAM BOOT (BLACK BOOT)  You may remove boot periodically. Perform daily dressing changes as noted below.  Wash the liner of the boot regularly and wear a sock when wearing the boot. It is recommended that you sleep in the boot until told otherwise  CALL THE OFFICE WITH ANY QUESTIONS OR CONCERNS: [phone]   Driving restrictions    Complete by:  As directed    No driving   Increase activity slowly as tolerated    Complete by:  As directed        Medication List    TAKE these medications   aspirin EC 325 MG tablet Take 1 tablet (325 mg total) by mouth daily.    docusate sodium 100 MG capsule Commonly known as:  COLACE Take 1 capsule (100 mg total) by mouth 2 (two) times daily.   methocarbamol 500 MG tablet Commonly known as:  ROBAXIN Take 1-2 tablets (500-1,000 mg total) by mouth every 6 (six) hours as needed for muscle spasms.   oxyCODONE 5 MG immediate release tablet Commonly known as:  Oxy IR/ROXICODONE Take 1-2 tablets (5-10 mg total) by mouth every 6 (six) hours as needed for breakthrough pain (take between percocet for breakthrouh pain only).   oxyCODONE-acetaminophen 5-325 MG tablet Commonly known as:  PERCOCET/ROXICET Take 1-2 tablets by mouth every 6 (six) hours as needed for moderate pain or severe pain. for pain What changed:  when to take this  reasons to take this      Follow-up Information    HANDY,MICHAEL H, MD. Schedule an appointment as soon as possible for a visit in 2 week(s).   Specialty:  Orthopedic Surgery Contact information: [address] [place] [address] [phone]           Discharge Instructions and Plan:  40 y/o male s/p fall off ladder with L calcaneus fracture   -comminuted intra-articular L calcaneus fracture s/p ORIF             NWB x 8 weeks             Splint x 2 weeks then unrestricted ROM                     Ice and elevate for swelling control             Does not need home therapy              Drain dc'd at bedside    - Pain management:             Percocet              Oxy IR              Robaxin   - ABL anemia/Hemodynamics             Stable   - Medical issues              Nicotine dependence                         Discussed risks of continued nicotine use as it relates to bone and wound healing. Continued use increases his risk of nonunion and deep infection    - DVT/PE prophylaxis:             ASA 325 mg po daily x 4 weeks - ID:              perop abx completed    - Activity:             NWB L leg o/w activity as tolerated              -  FEN/GI prophylaxis/Foley/Lines:             Reg diet             Dc IV   -Ex-fix/Splint care:             Discussed splint care with pt             Will remove at first follow up    - Impediments to fracture healing:             Nicotine use   - Dispo:             DC home today              Follow up in 2 weeks   Signed:  Jari Pigg, PA-C [facility] [phone] (P) 04/05/2016, 12:23 PM

## 2016-08-31 ENCOUNTER — Other Ambulatory Visit: Payer: Self-pay | Admitting: Orthopedic Surgery

## 2016-08-31 DIAGNOSIS — M79672 Pain in left foot: Secondary | ICD-10-CM

## 2016-09-07 ENCOUNTER — Ambulatory Visit
Admission: RE | Admit: 2016-09-07 | Discharge: 2016-09-07 | Disposition: A | Discharge status: Home / Self Care | Payer: Worker's Compensation | Source: Ambulatory Visit | Attending: Orthopedic Surgery | Admitting: Orthopedic Surgery

## 2016-09-07 DIAGNOSIS — M79672 Pain in left foot: Secondary | ICD-10-CM

## 2016-09-07 MED ORDER — METHYLPREDNISOLONE ACETATE 40 MG/ML INJ SUSP (RADIOLOG
120.0000 mg | Freq: Once | INTRAMUSCULAR | Status: AC
Start: 2016-09-07 — End: 2016-09-07
  Administered 2016-09-07: 120 mg via INTRA_ARTICULAR

## 2016-09-07 MED ORDER — IOPAMIDOL (ISOVUE-M 200) INJECTION 41%
1.0000 mL | Freq: Once | INTRAMUSCULAR | Status: AC
  Administered 2016-09-07: 1 mL via INTRA_ARTICULAR

## 2017-01-19 ENCOUNTER — Other Ambulatory Visit: Payer: Self-pay | Admitting: Orthopedic Surgery

## 2017-01-19 DIAGNOSIS — M19072 Primary osteoarthritis, left ankle and foot: Secondary | ICD-10-CM

## 2017-02-20 ENCOUNTER — Ambulatory Visit
Admission: RE | Admit: 2017-02-20 | Discharge: 2017-02-20 | Disposition: A | Discharge status: Home / Self Care | Payer: Worker's Compensation | Source: Ambulatory Visit | Attending: Orthopedic Surgery | Admitting: Orthopedic Surgery

## 2017-02-20 DIAGNOSIS — M19072 Primary osteoarthritis, left ankle and foot: Secondary | ICD-10-CM

## 2017-02-20 MED ORDER — IOPAMIDOL (ISOVUE-M 200) INJECTION 41%
1.0000 mL | Freq: Once | INTRAMUSCULAR | Status: AC
  Administered 2017-02-20: 1 mL via INTRA_ARTICULAR

## 2017-02-20 MED ORDER — METHYLPREDNISOLONE ACETATE 40 MG/ML INJ SUSP (RADIOLOG
120.0000 mg | Freq: Once | INTRAMUSCULAR | Status: AC
  Administered 2017-02-20: 120 mg via INTRA_ARTICULAR

## 2017-11-29 IMAGING — CT CT ANKLE*L* W/O CM
3 of 4 series · 12 of 33 positions shown, 14 images · non-contrast
Comparison: Left ankle radiographs performed earlier today at [DATE]
p.m.

CLINICAL DATA: Assess known left ankle fracture. Status post fall
off ladder yesterday. Initial encounter.

EXAM:
CT OF THE LEFT ANKLE WITHOUT CONTRAST
TECHNIQUE: Multidetector CT imaging of the left ankle was performed according
to the standard protocol. Multiplanar CT image reconstructions were
also generated.

[Series 5: lt ankle 1.5 st · axial · 0.57mm/px · z∈[+162,+327]mm · 4 of 154 slices shown, 5 images]
[im 22/154  soft-tissue]
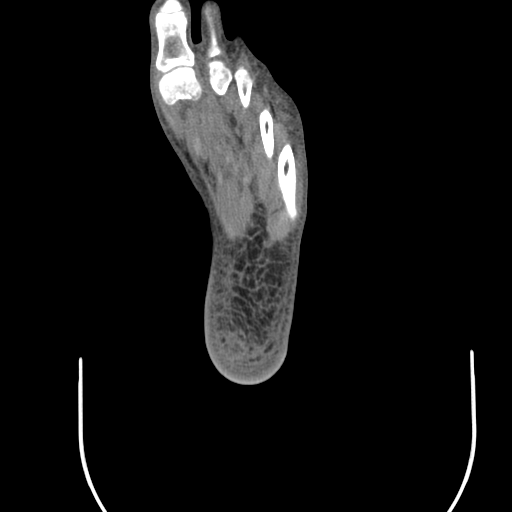
[im 22/154  bone]
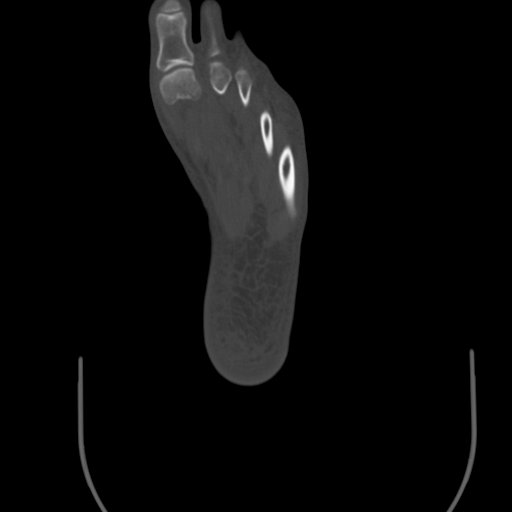
[im 66/154  bone]
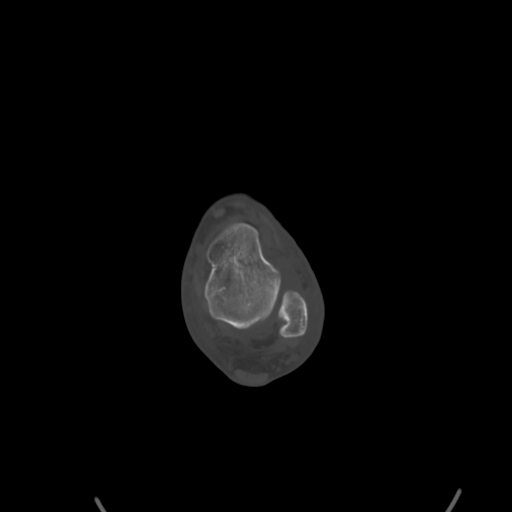
[im 88/154  bone]
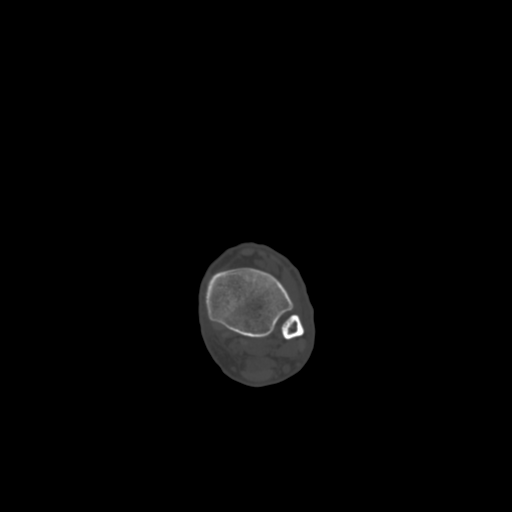
[im 132/154  bone]
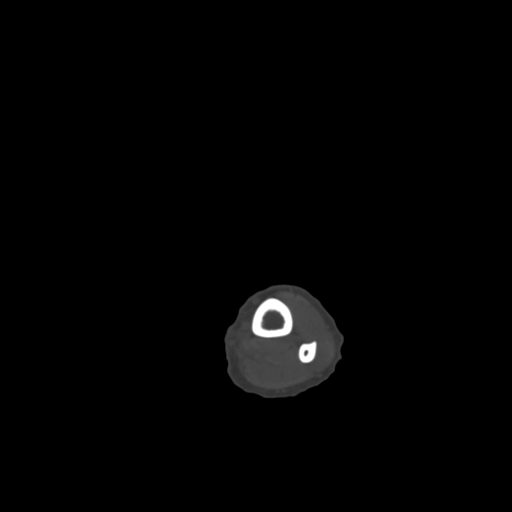

[Series 9: lt ankle cor st · coronal · 0.33mm/px · 3 of 140 slices shown]
[im 28/140  bone]
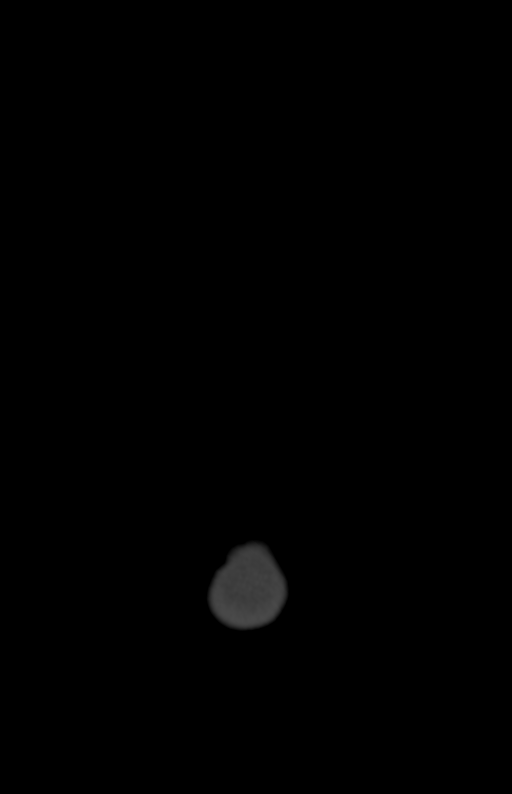
[im 56/140  bone]
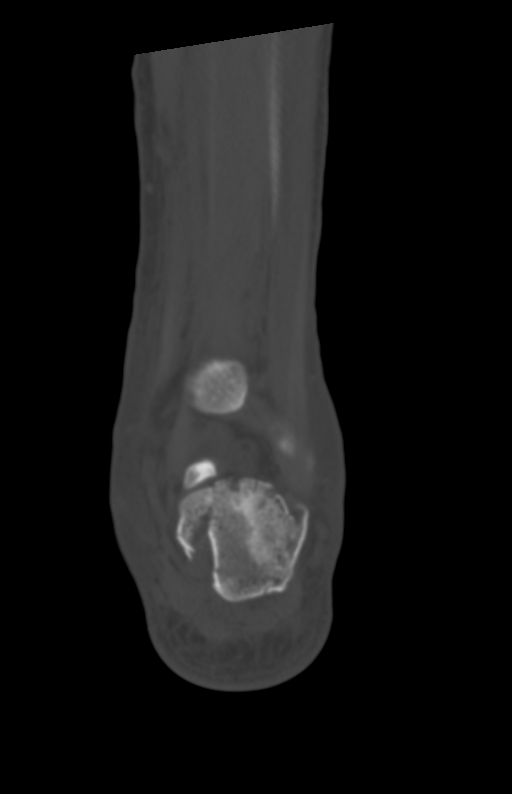
[im 84/140  bone]
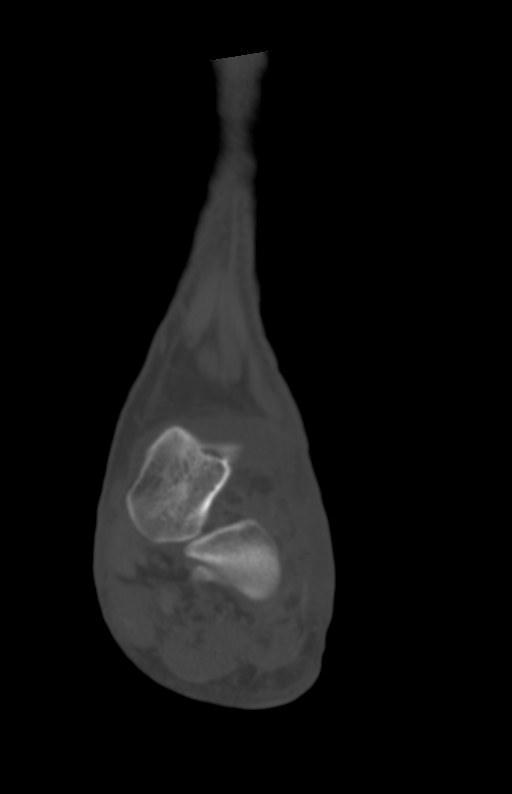

[Series 11: lt ankle sag st · sagittal · 0.37mm/px · 5 of 58 slices shown, 6 images]
[im 20/58  bone]
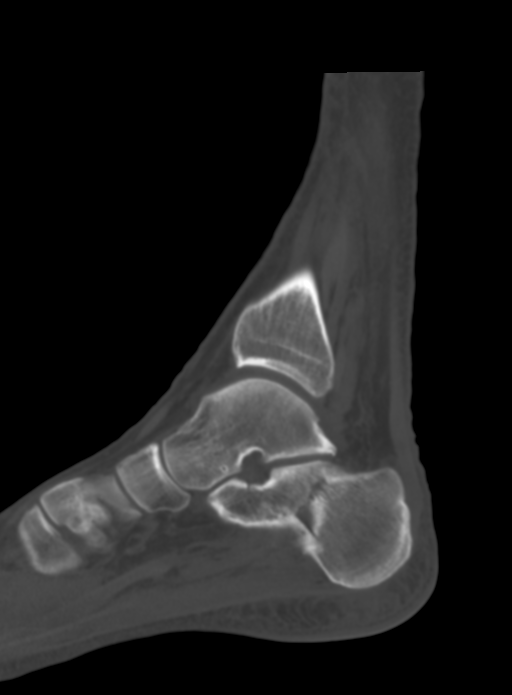
[im 24/58  bone]
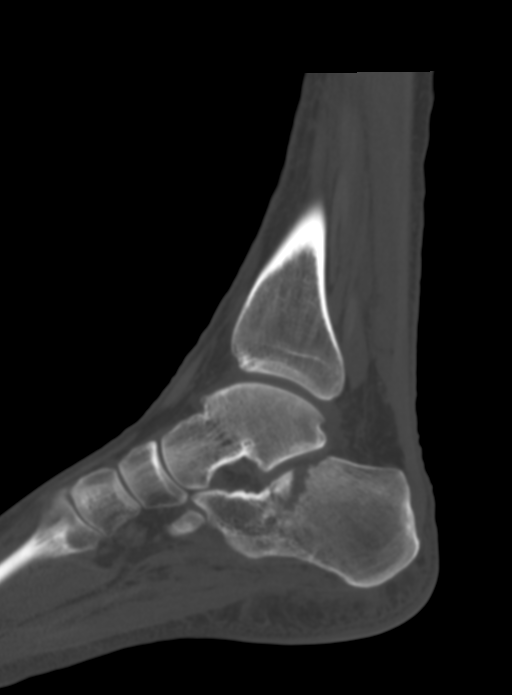
[im 29/58  soft-tissue]
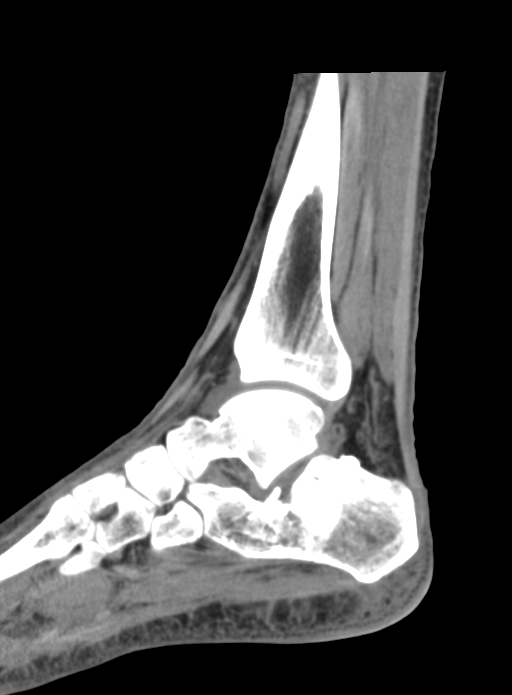
[im 29/58  bone]
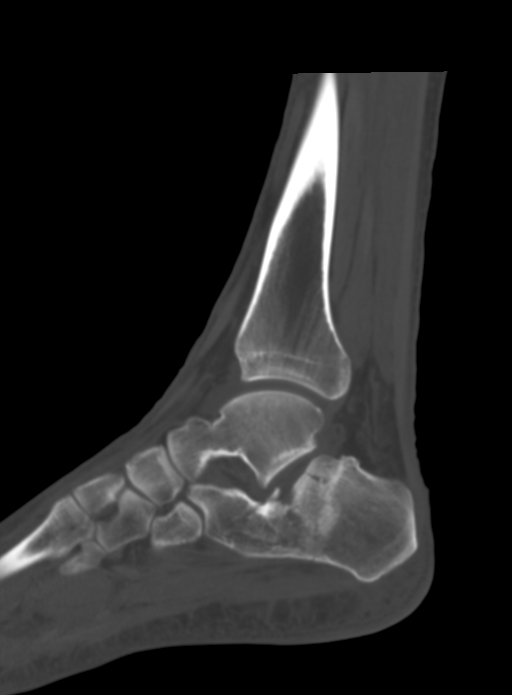
[im 34/58  bone]
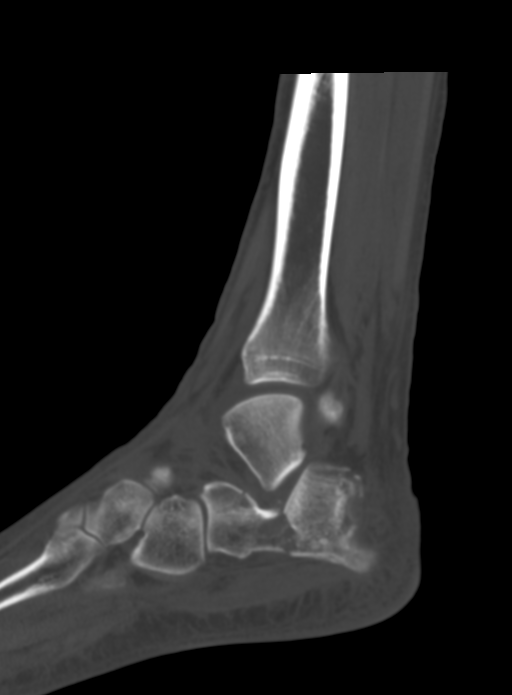
[im 39/58  bone]
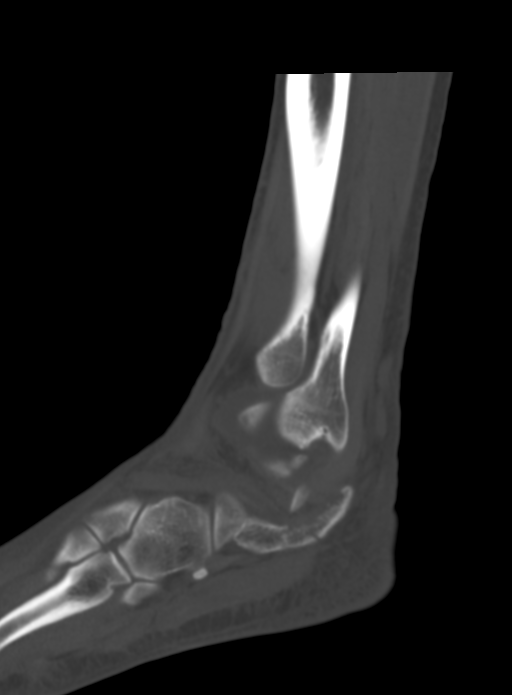

[12 of 33 positions shown; findings below may reference images not displayed]

FINDINGS: Bones/Joint/Cartilage

There is a joint depression type fracture of the calcaneus, without
evidence of extension into the posterior calcaneus. This reflects Juanjose
Samsonaite type IV fracture with regard to the posterior facet, with
comminuted depressed central portion of the calcaneus at the
posterior facet. An oblique fracture extends across the
sustentaculum tali, and a minimally displaced fracture extends to
the sinus tarsi and adjacent middle facet. The anterior facet is
grossly unremarkable, though dissociated from the remainder of the
calcaneus.

No additional fractures are seen. An os subfibulare is noted. The
ankle mortise is grossly unremarkable in appearance. An ankle joint
effusion is seen. The cartilage is not well assessed.

Ligaments

Suboptimally assessed by CT.

Muscles and Tendons

The flexor and extensor tendons are unremarkable in appearance. The
peroneal tendons run adjacent to the fracture site, but are
otherwise unremarkable. The Achilles tendon remains intact.
Visualized musculature is grossly unremarkable in appearance.

Soft tissues

Soft tissue edema is seen tracking about the ankle and dorsum of the
foot. Edema is noted at the sinus tarsi. The vasculature is not well
assessed without contrast.
IMPRESSION: 1. Joint depression type fracture of the calcaneus. This reflects Juanjose
Samsonaite type IV fracture with regard to the posterior facet, with
comminuted depressed central portion of the calcaneus at the
posterior facet. Oblique fracture extends across the sustentaculum
tali, and minimally displaced fracture extends to the sinus tarsi
and adjacent middle facet. Anterior facet is grossly unremarkable,
though dissociated from the remainder of the calcaneus.
2. Soft tissue edema noted tracking about the ankle and dorsum of
the foot.
3. Ankle joint effusion noted.
4. Peroneal tendons run adjacent to the fracture, but are otherwise
unremarkable at this time.
5. Os subfibulare noted.

## 2018-11-03 IMAGING — XA DG FLUORO GUIDE NDL PLC/BX
2 series · 2 of 2 positions shown · non-contrast
Comparison: none

CLINICAL DATA: LEFT foot pain.  ARTHRITIS.

[Series 1: ortho standard · 1 of 1 slices shown (1 of 2)]
[im 1/1]
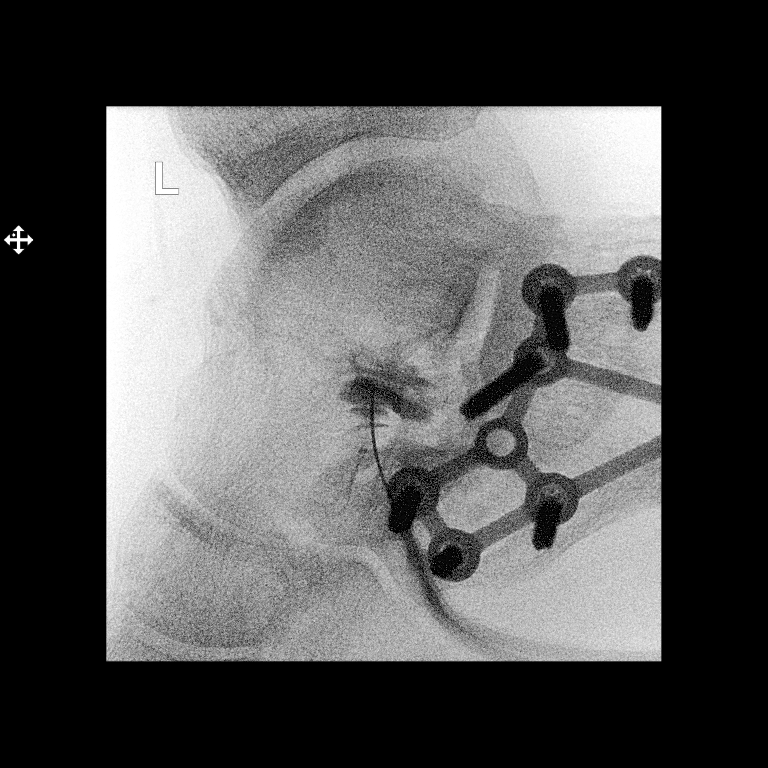

[Series 2: ortho standard · 1 of 1 slices shown (2 of 2)]
[im 1/1]
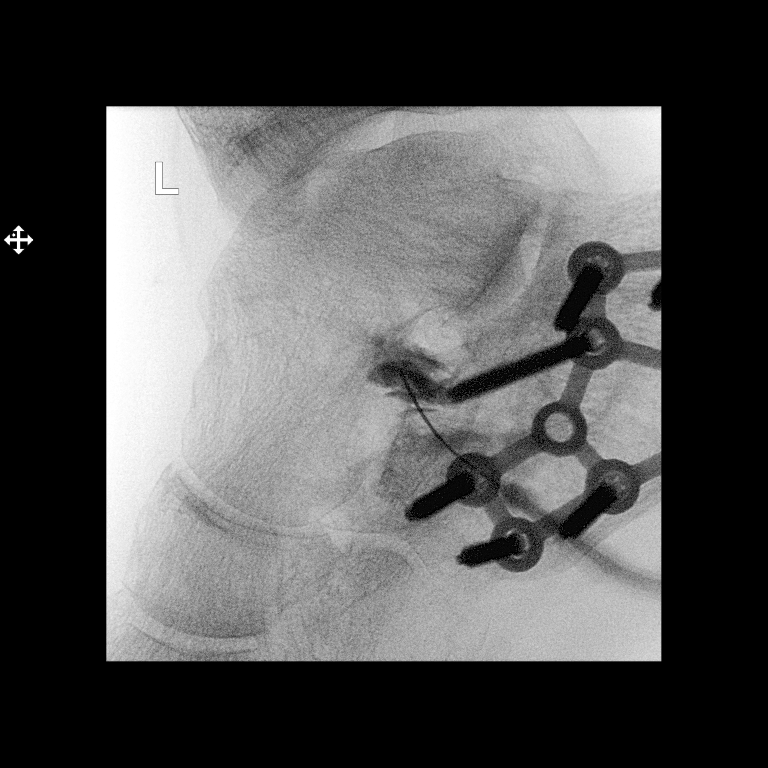

[2 of 2 positions shown; findings below may reference images not displayed]

FLUOROSCOPY TIME:  14 seconds corresponding to a Dose Area Product
of 1.15 ?Gy*m2

PROCEDURE:
LEFT SUBTALAR JOINT UNDER FLUOROSCOPY

An appropriate skin entrance site was determined. The site was
marked, prepped with Betadine, draped in the usual sterile fashion,
and infiltrated locally with 1% Lidocaine. A 25 gauge needle was
placed subtalar joint. Contrast injection showed intra-articular
spread. I injected 120 mg Depo-Medrol along with 1 mL 1% lidocaine.
Concordant reproduction of pain was observed.
IMPRESSION: Technically successful LEFT subtalar joint injection.

## 2023-06-01 ENCOUNTER — Inpatient Hospital Stay (HOSPITAL_COMMUNITY)
Admission: RE | Admit: 2023-06-01 | Discharge: 2023-06-01 | Disposition: A | Discharge status: Home / Self Care | Payer: Self-pay | Source: Ambulatory Visit

## 2023-06-01 ENCOUNTER — Ambulatory Visit (HOSPITAL_COMMUNITY)
Admission: EM | Admit: 2023-06-01 | Discharge: 2023-06-01 | Disposition: A | Discharge status: Home / Self Care | Payer: Self-pay | Attending: Emergency Medicine | Admitting: Emergency Medicine

## 2023-06-01 ENCOUNTER — Encounter (HOSPITAL_COMMUNITY): Payer: Self-pay | Admitting: *Deleted

## 2023-06-01 DIAGNOSIS — K625 Hemorrhage of anus and rectum: Secondary | ICD-10-CM | POA: Insufficient documentation

## 2023-06-01 LAB — CBC WITH DIFFERENTIAL/PLATELET
Abs Immature Granulocytes: 0.06 10*3/uL (ref 0.00–0.07)
Basophils Absolute: 0.1 10*3/uL (ref 0.0–0.1)
Basophils Relative: 1 %
Eosinophils Absolute: 0.2 10*3/uL (ref 0.0–0.5)
Eosinophils Relative: 2 %
HCT: 44.4 % (ref 39.0–52.0)
Hemoglobin: 15.8 g/dL (ref 13.0–17.0)
Immature Granulocytes: 1 %
Lymphocytes Relative: 21 %
Lymphs Abs: 1.8 10*3/uL (ref 0.7–4.0)
MCH: 36.6 pg — ABNORMAL HIGH (ref 26.0–34.0)
MCHC: 35.6 g/dL (ref 30.0–36.0)
MCV: 102.8 fL — ABNORMAL HIGH (ref 80.0–100.0)
Monocytes Absolute: 0.8 10*3/uL (ref 0.1–1.0)
Monocytes Relative: 9 %
Neutro Abs: 5.9 10*3/uL (ref 1.7–7.7)
Neutrophils Relative %: 66 %
Platelets: 203 10*3/uL (ref 150–400)
RBC: 4.32 MIL/uL (ref 4.22–5.81)
RDW: 11.7 % (ref 11.5–15.5)
WBC: 8.8 10*3/uL (ref 4.0–10.5)
nRBC: 0 % (ref 0.0–0.2)

## 2023-06-01 LAB — COMPREHENSIVE METABOLIC PANEL
ALT: 31 U/L (ref 0–44)
AST: 53 U/L — ABNORMAL HIGH (ref 15–41)
Albumin: 3.6 g/dL (ref 3.5–5.0)
Alkaline Phosphatase: 73 U/L (ref 38–126)
Anion gap: 13 (ref 5–15)
BUN: <5 mg/dL — ABNORMAL LOW (ref 6–20)
CO2: 24 mmol/L (ref 22–32)
Calcium: 9.1 mg/dL (ref 8.9–10.3)
Chloride: 103 mmol/L (ref 98–111)
Creatinine, Ser: 0.78 mg/dL (ref 0.61–1.24)
GFR, Estimated: >60 mL/min (ref >60)
Glucose, Bld: 115 mg/dL — ABNORMAL HIGH (ref 70–99)
Potassium: 3.5 mmol/L (ref 3.5–5.1)
Sodium: 140 mmol/L (ref 135–145)
Total Bilirubin: 0.8 mg/dL (ref <1.2)
Total Protein: 6.5 g/dL (ref 6.5–8.1)

## 2023-06-01 NOTE — Discharge Instructions (Addendum)
We have obtained those some basic lab work today in clinic.  We will contact you if there is anything emergent.  In the meantime please follow-up with [facility] to get established with a primary care provider and complete routine screenings such as colonoscopies.  Do not strain to evacuate your bowels, you can consider starting an over-the-counter stool softener like Colace.  If you have any more extensive rectal bleeding, please seek immediate care at the nearest emergency department.  I advise that you take a photo of this as well to present to the staff.

## 2023-06-01 NOTE — ED Provider Notes (Signed)
[facility]    CSN: [id number] Arrival date & time: 06/01/23  1655      History   Chief Complaint Chief Complaint  Patient presents with   Rectal Bleeding    HPI Lawrence Mack is a 47 y.o. male.   Patient presents to clinic for bright red bleeding by his rectum for the past 2 days.  On Wednesday he noticed some bright red blood with wiping.  Yesterday he noticed a lot less stool and a lot more blood in the toilet.  He denies any clots.  He has not had any dizziness or weakness.  He has not really ate today, has been laying in bed all day.  Has not had a bowel movement today.  Has a history of hemorrhoids in the past and this does not feel the same.  He has not had any abdominal pain.  He is stressed and nervous.  Reports his mother was diagnosed with rectal cancer.  His brother died from a gastrointestinal bleed.  He has not taken any medications or tried any interventions for his symptoms.  He does not have any insurance and does not have a primary care provider.   The history is provided by the patient and medical records.  Rectal Bleeding   Past Medical History:  Diagnosis Date   Joint pain    Nicotine dependence     Patient Active Problem List   Diagnosis Date Noted   Nicotine dependence    Displaced intraarticular fracture of left calcaneus, initial encounter for closed fracture 04/04/2016   H. pylori infection 05/15/2013    Past Surgical History:  Procedure Laterality Date   MOUTH SURGERY     ORIF CALCANEOUS FRACTURE Left 04/04/2016   Procedure: OPEN REDUCTION INTERNAL FIXATION (ORIF) LEFT  CALCANEOUS FRACTURE;  Surgeon: Myrene Galas, MD;  Location: [facility] OR;  Service: Orthopedics;  Laterality: Left;       Home Medications    Prior to Admission medications   Medication Sig Start Date End Date Taking? Authorizing Provider  aspirin EC 325 MG tablet Take 1 tablet (325 mg total) by mouth daily. 04/05/16   Montez Morita, PA-C  docusate sodium  (COLACE) 100 MG capsule Take 1 capsule (100 mg total) by mouth 2 (two) times daily. 04/05/16   Montez Morita, PA-C  methocarbamol (ROBAXIN) 500 MG tablet Take 1-2 tablets (500-1,000 mg total) by mouth every 6 (six) hours as needed for muscle spasms. 04/05/16   Montez Morita, PA-C  oxyCODONE (OXY IR/ROXICODONE) 5 MG immediate release tablet Take 1-2 tablets (5-10 mg total) by mouth every 6 (six) hours as needed for breakthrough pain (take between percocet for breakthrouh pain only). 04/05/16   Montez Morita, PA-C  oxyCODONE-acetaminophen (PERCOCET/ROXICET) 5-325 MG tablet Take 1-2 tablets by mouth every 6 (six) hours as needed for moderate pain or severe pain. for pain 04/05/16   Montez Morita, PA-C    Family History Family History  Problem Relation Age of Onset   Rectal cancer Mother    Lung cancer Father        smoker    Social History Social History   Tobacco Use   Smoking status: Every Day    Current packs/day: 1.00    Average packs/day: 1 pack/day for 22.0 years (22.0 ttl pk-yrs)    Types: Cigarettes   Smokeless tobacco: Never  Vaping Use   Vaping status: Never Used  Substance Use Topics   Alcohol use: Yes    Alcohol/week: 6.0 standard drinks of alcohol  Types: 6 Cans of beer per week    Comment: 4-6 beers/day   Drug use: Yes    Types: Marijuana     Allergies   No known allergies   Review of Systems Review of Systems  Gastrointestinal:  Positive for hematochezia.   Per HPI  Physical Exam Triage Vital Signs ED Triage Vitals  Encounter Vitals Group     BP 06/01/23 1707 (!) 137/91     Systolic BP Percentile --      Diastolic BP Percentile --      Pulse Rate 06/01/23 1707 (!) 103     Resp 06/01/23 1707 18     Temp 06/01/23 1707 98.9 F (37.2 C)     Temp Source 06/01/23 1707 Oral     SpO2 06/01/23 1707 98 %     Weight --      Height --      Head Circumference --      Peak Flow --      Pain Score 06/01/23 1705 0     Pain Loc --      Pain Education --       Exclude from Growth Chart --    No data found.  Updated Vital Signs BP (!) 137/91 (BP Location: Right Arm)   Pulse (!) 103   Temp 98.9 F (37.2 C) (Oral)   Resp 18   SpO2 98%   Visual Acuity Right Eye Distance:   Left Eye Distance:   Bilateral Distance:    Right Eye Near:   Left Eye Near:    Bilateral Near:     Physical Exam Vitals and nursing note reviewed.  Constitutional:      Appearance: Normal appearance.  HENT:     Head: Normocephalic and atraumatic.     Right Ear: External ear normal.     Left Ear: External ear normal.     Nose: Nose normal.     Mouth/Throat:     Mouth: Mucous membranes are moist.  Eyes:     Conjunctiva/sclera: Conjunctivae normal.  Cardiovascular:     Rate and Rhythm: Normal rate and regular rhythm.     Pulses: Normal pulses.  Pulmonary:     Effort: Pulmonary effort is normal. No respiratory distress.     Breath sounds: Normal breath sounds.  Abdominal:     General: Abdomen is flat. Bowel sounds are normal. There is no distension.     Palpations: Abdomen is soft. There is no mass.     Tenderness: There is no abdominal tenderness. There is no guarding or rebound.     Hernia: No hernia is present.  Musculoskeletal:        General: Normal range of motion.  Skin:    General: Skin is warm and dry.  Neurological:     General: No focal deficit present.     Mental Status: He is alert.  Psychiatric:        Mood and Affect: Mood normal.      UC Treatments / Results  Labs (all labs ordered are listed, but only abnormal results are displayed) Labs Reviewed  COMPREHENSIVE METABOLIC PANEL  CBC WITH DIFFERENTIAL/PLATELET    EKG   Radiology No results found.  Procedures Procedures (including critical care time)  Medications Ordered in UC Medications - No data to display  Initial Impression / Assessment and Plan / UC Course  I have reviewed the triage vital signs and the nursing notes.  Pertinent labs & imaging results that were  available during my care of the patient were reviewed by me and considered in my medical decision making (see chart for details).  Vitals in triage reviewed, patient is hemodynamically stable.  No active bleeding.  Denies any hemorrhoids.  Abdomen is soft and nontender with active bowel sounds.  No guarding or rebound.  No distention.  Patient is anxious.  Will obtain basic labs to check for hemoglobin and electrolytes.  Strict emergency precautions given if rectal bleeding persists or symptoms evolve.  Encouraged to get established with a PCP.  Plan of care, follow-up care and return precautions given, no questions at this time.     Final Clinical Impressions(s) / UC Diagnoses   Final diagnoses:  Rectal bleeding     Discharge Instructions      We have obtained those some basic lab work today in clinic.  We will contact you if there is anything emergent.  In the meantime please follow-up with [facility] to get established with a primary care provider and complete routine screenings such as colonoscopies.  Do not strain to evacuate your bowels, you can consider starting an over-the-counter stool softener like Colace.  If you have any more extensive rectal bleeding, please seek immediate care at the nearest emergency department.  I advise that you take a photo of this as well to present to the staff.       ED Prescriptions   None    PDMP not reviewed this encounter.   Kassadi Presswood, [place], [place] 06/01/23 [id number]

## 2023-06-01 NOTE — ED Triage Notes (Signed)
Pt states he has BRB from rectum x 2 days. He states its a lot of blood no clots, he denies any hemorrhoids. He is worried since mother had rectal cancer.
